# Patient Record
Sex: Male | Born: 1960 | Race: White | Hispanic: No | Marital: Married | Smoking: Never smoker
Health system: Southern US, Community
[De-identification: ages and names within clinical notes are randomized; demographics above are authoritative.]

## PROBLEM LIST (undated history)

## (undated) DIAGNOSIS — K219 Gastro-esophageal reflux disease without esophagitis: Secondary | ICD-10-CM

## (undated) DIAGNOSIS — M545 Low back pain, unspecified: Secondary | ICD-10-CM

## (undated) DIAGNOSIS — J45909 Unspecified asthma, uncomplicated: Secondary | ICD-10-CM

## (undated) DIAGNOSIS — G43909 Migraine, unspecified, not intractable, without status migrainosus: Secondary | ICD-10-CM

## (undated) DIAGNOSIS — T7840XA Allergy, unspecified, initial encounter: Secondary | ICD-10-CM

## (undated) DIAGNOSIS — G4730 Sleep apnea, unspecified: Secondary | ICD-10-CM

## (undated) HISTORY — DX: Sleep apnea, unspecified: G47.30

## (undated) HISTORY — DX: Unspecified asthma, uncomplicated: J45.909

## (undated) HISTORY — DX: Low back pain: M54.5

## (undated) HISTORY — DX: Gastro-esophageal reflux disease without esophagitis: K21.9

## (undated) HISTORY — DX: Migraine, unspecified, not intractable, without status migrainosus: G43.909

## (undated) HISTORY — PX: TONSILLECTOMY: SUR1361

## (undated) HISTORY — PX: HERNIA REPAIR: SHX51

## (undated) HISTORY — DX: Low back pain, unspecified: M54.50

## (undated) HISTORY — DX: Allergy, unspecified, initial encounter: T78.40XA

---

## 2001-07-27 ENCOUNTER — Ambulatory Visit (HOSPITAL_COMMUNITY): Admission: RE | Admit: 2001-07-27 | Discharge: 2001-07-27 | Payer: Self-pay | Admitting: Emergency Medicine

## 2001-07-27 ENCOUNTER — Encounter: Payer: Self-pay | Admitting: Internal Medicine

## 2001-08-21 ENCOUNTER — Emergency Department (HOSPITAL_COMMUNITY): Admission: EM | Admit: 2001-08-21 | Discharge: 2001-08-21 | Payer: Self-pay | Admitting: Emergency Medicine

## 2001-08-21 ENCOUNTER — Encounter: Payer: Self-pay | Admitting: Emergency Medicine

## 2001-11-01 ENCOUNTER — Ambulatory Visit (HOSPITAL_COMMUNITY): Admission: RE | Admit: 2001-11-01 | Discharge: 2001-11-01 | Payer: Self-pay | Admitting: Specialist

## 2001-11-01 ENCOUNTER — Encounter: Payer: Self-pay | Admitting: Specialist

## 2001-11-29 ENCOUNTER — Ambulatory Visit (HOSPITAL_COMMUNITY): Admission: RE | Admit: 2001-11-29 | Discharge: 2001-11-29 | Payer: Self-pay | Admitting: Specialist

## 2004-10-12 ENCOUNTER — Encounter: Admission: RE | Admit: 2004-10-12 | Discharge: 2004-10-12 | Payer: Self-pay | Admitting: Occupational Medicine

## 2007-07-17 ENCOUNTER — Emergency Department (HOSPITAL_COMMUNITY): Admission: EM | Admit: 2007-07-17 | Discharge: 2007-07-17 | Payer: Self-pay | Admitting: Emergency Medicine

## 2007-07-17 ENCOUNTER — Encounter: Payer: Self-pay | Admitting: Emergency Medicine

## 2007-11-28 ENCOUNTER — Emergency Department (HOSPITAL_BASED_OUTPATIENT_CLINIC_OR_DEPARTMENT_OTHER): Admission: EM | Admit: 2007-11-28 | Discharge: 2007-11-28 | Payer: Self-pay | Admitting: Emergency Medicine

## 2009-05-19 ENCOUNTER — Ambulatory Visit: Payer: Self-pay | Admitting: Diagnostic Radiology

## 2009-05-19 ENCOUNTER — Emergency Department (HOSPITAL_BASED_OUTPATIENT_CLINIC_OR_DEPARTMENT_OTHER): Admission: EM | Admit: 2009-05-19 | Discharge: 2009-05-19 | Payer: Self-pay | Admitting: Emergency Medicine

## 2010-02-13 ENCOUNTER — Encounter: Payer: Self-pay | Admitting: Emergency Medicine

## 2010-04-12 LAB — POCT CARDIAC MARKERS
CKMB, poc: 2.4 ng/mL (ref 1.0–8.0)
Myoglobin, poc: 401 ng/mL (ref 12–200)

## 2010-04-12 LAB — COMPREHENSIVE METABOLIC PANEL
AST: 33 U/L (ref 0–37)
Alkaline Phosphatase: 98 U/L (ref 39–117)
Chloride: 111 mEq/L (ref 96–112)
Creatinine, Ser: 1.3 mg/dL (ref 0.4–1.5)
Glucose, Bld: 201 mg/dL — ABNORMAL HIGH (ref 70–99)
Potassium: 3.9 mEq/L (ref 3.5–5.1)
Sodium: 144 mEq/L (ref 135–145)

## 2010-04-12 LAB — LACTIC ACID, PLASMA
Lactic Acid, Venous: 1.2 mmol/L (ref 0.5–2.2)
Lactic Acid, Venous: 2.3 mmol/L — ABNORMAL HIGH (ref 0.5–2.2)

## 2010-04-12 LAB — PROTIME-INR
INR: 1.02 (ref 0.00–1.49)
Prothrombin Time: 13.3 seconds (ref 11.6–15.2)

## 2010-04-12 LAB — CBC
Hemoglobin: 15.3 g/dL (ref 13.0–17.0)
Platelets: 208 10*3/uL (ref 150–400)
WBC: 11.7 10*3/uL — ABNORMAL HIGH (ref 4.0–10.5)

## 2010-04-12 LAB — URINALYSIS, ROUTINE W REFLEX MICROSCOPIC
Glucose, UA: NEGATIVE mg/dL
Ketones, ur: NEGATIVE mg/dL
pH: 5 (ref 5.0–8.0)

## 2010-04-12 LAB — DIFFERENTIAL
Basophils Absolute: 0 10*3/uL (ref 0.0–0.1)
Basophils Relative: 0 % (ref 0–1)
Lymphocytes Relative: 11 % — ABNORMAL LOW (ref 12–46)

## 2010-04-12 LAB — APTT: aPTT: 28 seconds (ref 24–37)

## 2010-06-07 NOTE — Consult Note (Signed)
NAME:  [name], [name] NO.:  [id number]   MEDICAL RECORD NO.:  [id number]          PATIENT TYPE:  EMS   LOCATION:  MAJO                         FACILITY:  [facility]   PHYSICIAN:  Levert Feinstein, MD          DATE OF BIRTH:  April 11, 1960   DATE OF CONSULTATION:  07/17/2007  DATE OF DISCHARGE:  07/17/2007                                 CONSULTATION   CHIEF COMPLAINT:  Headache.   HISTORY OF PRESENT ILLNESS:  The patient is a 50 year old right-handed  male, with past medical history of headache in the past 6 years.  He  described daily chronic holocranial pressure headache, 4/10, however,  couple times a week.  It is often exacerbated to a severe, most often  lateralized left-sided pounding headache with associated nausea,  photophonophobia last couple hours, worsening with motion.  For that  reason, he had been taking ibuprofen on a daily basis.  He was  previously evaluated by my colleague Dr. Vickey Huger, who has suggested  Imitrex; however the patient does not like medicine, he never tried  Imitrex instead he is taking ibuprofen 2 to 4 tablets every day.   In addition, he has described an episode of headache preceded by a  visual aura about 6 years ago, and this morning he had a similar  experience, and he noticed  half moon in his right visual field, lasts  about 20 to 30 minutes, followed by left occipital headache and  spreading to left parietal, temporal, and the retroorbital region,  pounding 8/10, with associated nausea, photophonophobia.  He initially  drove to the [facility] ER by his wife and received a couple doses of  morphine and his headache has gradually subsided.   Currently, he denies any visual change, lateralized motor sensory  deficit, still has 4/10 holocranial pressure headache which is better  than his baseline.   REVIEW OF SYSTEMS:  He has 2 weeks' history of intermittent chest  palpitation, and there was no recurrent similar symptoms today.  There  was no chest pain, no lateralized motor-sensory deficit.   PAST MEDICAL HISTORY:  Migraine headaches, asthma, and rosacea.   HOME MEDICATIONS:  Including ibuprofen, doxycycline p.r.n., and  albuterol inhaler p.r.n.   SOCIAL HISTORY:  He is married, has 2 daughters, is a Biochemist, clinical.  Denies smoke or drink.   PAST SURGICAL HISTORY:  None.   FAMILY HISTORY:  Mother has suffered hypertension, diabetes, and stomach  ulcer.  Brother has diabetes, hyperlipidemia.  Father has deceased.   PHYSICAL EXAMINATION:  VITALS SIGNS:  Temperature 98.2, blood pressure  141/88, heart rate of 85.  CARDIAC:  Regular rate and rhythm.  PULMONARY:  Clear to auscultation bilaterally.  NECK:  Supple.  No carotid bruits.  NEUROLOGIC:  He is alert and oriented.  No dysarthria.  No aphasia.  Cranial nerves II-XII.  Pupils equal, round, reactive to light and  accommodation.  Fundi were sharp bilaterally.  Visual fields were full  on confrontational test.  Facial sensation is normal.  There was a mild  shallow  left nasolabial fold,  which is at his baseline.  Head turning  and shoulder shrugging were normal and symmetric.  Uvula and tongue  midline.  Tongue protrusion into cheek.  Strength was normal.  MOTOR EXAMINATION:  5/5 in all 4 extremities.  Normal tone and bulk.  Sensory intact to light touch, pinprick.  Deep tendon reflexes 2/4.  Plantar responses were flexor.  Coordination, normal finger-to-nose,  heel-to-shin.  There was no dysmetric gait, was narrow based and steady.  He was able to perform tiptoe, heel, tandem walk without difficulty.   CT of the brain without contrast reviewed.  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   ASSESSMENT AND PLAN:  A 50 year old gentleman presenting with migraine  with visual aura, similar episode in the past, nowback to baseline.   1. He does not need to be kept in the  hospital and can be followed up      by the [facility] in 1-2 weeks post discharge      for headache management.  2. For stroke prevention, he will benefit from aspirin 81 mg every      day.  3. The described right medullar 6-mm possible lacunar, indicate an old      stroke at most, and we will complete the rest of his stroke workup      as an outpatient  4. advised the patient to stop daily analgesic use, we will initiate      preventive medication on his office visit.      Levert Feinstein, MD  Electronically Signed     YY/MEDQ  D:  07/17/2007  T:  07/18/2007  Job:  [id number]

## 2010-06-10 NOTE — Op Note (Signed)
NAME:  [name], [name]                           ACCOUNT NO.:  [id number]   MEDICAL RECORD NO.:  [id number]                   PATIENT TYPE:  AMB   LOCATION:  DAY                                  FACILITY:  [facility]   PHYSICIAN:  Jene Every, M.D.                 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 Extensive grade 3 changes of the medial femoral condyle. A complex  tear not only of the posterior but posterolateral portion of the medial  meniscus.   PROCEDURE:  Left knee arthroscopy, chondroplasty of the medial femoral  condyle, partial medial meniscectomy.   ANESTHESIA:  General.   ASSISTANT:  Roma Schanz, P.A..   BRIEF HISTORY AND INDICATIONS:  The patient is a 50 year old with a complex  tear in the posterior horn having mechanical symptoms of giving way,  chondromalacia of the medial femoral condyle, possible grade 4 lesions.  Operative intervention was indicated for partial medial meniscectomy as well  as chondroplasty of the femoral condyle and possible microfracture  technique, a grade 4 lesion was noted. The risks and benefits were discussed  including bleeding, infection, injury to neurovascular structures, no change  in symptoms, worsening symptoms, need for repeat debridement, total knee  arthroplasty in the future, etc.   TECHNIQUE:  With the patient in the supine position after an adequate level  of general endotracheal anesthesia and 1 gm of Kefzol, the left lower  extremity was prepped and draped in the usual sterile fashion. A lateral  parapatellar portal and a  superomedial parapatellar portal was fashioned  with a #11 blade. The ingress cannula was atraumatically placed, irrigant  was utilized to insufflate the joint.   A camera was inserted via the lateral portal and under direct visualization  and the medial parapatellar portal was fashioned with a #11 blade, ingress  cannula atraumatically placed. Inspection revealed normal patellofemoral  tracking, some minor grade 2 changes of the patella. The ACL and PCL were  unremarkable. The lateral compartment revealed normal lateral meniscus,  femoral condyle and tibial plateau and lateral meniscus stable to palpation.  Neither compartment revealed extensive grade 3 changes of the femoral  condyle more of the weightbearing surface and posteriorly the area of the  MRI which showed the grade 4 lesion. It was actually a grade 3 with a  cleavage down to but not including the bone and therefore that  microfracturing technique was unnecessary here. I introduced the shaver and  performed a chondroplasty to a stable base. I then evaluated  the meniscus  which was extremely unstable. Posteriorly, there were extensive horizontal  and vertical components with a complex tear noted. This extended to this  surface and to the vascular junction; however, due to the multiple cleavage  planes, I felt this was not a repairable meniscus. Again unstable to  palpation. I therefore introduced a basket rongeur and performed a partial  medial meniscectomy to a stable base to the juncture of the middle and the  posterior third posteriorly; however, medially I had extended through the  mid portion of the meniscus where it was displaced and unstable and complex.  I therefore continued the resection there contouring medially and anteriorly  such that it would not be unstable with the remnant of the meniscus. This  was completed with the basket rongeur, a shaver, a right angled rongeur as  well. Due to the tightness of the joint in  the chondromalacia, it was  somewhat difficult. We resected it to a stable base. We then used  electrocautery to cauterize the slight bleeding that was noted. The  resection at least anterior and medially extended to the vascular junction.  Following this, I flexion extended the knee and put it in multiple positions  including the figure four and there was no invagination of the remnant of  the medial meniscus into the joint. There was no McMurray or any mechanical  problem noted. The knee was copiously lavaged. I reduced the pressure and  there was no evidence of active bleeding. I copiously lavaged the knee joint  and reinspected the medial femoral condyle again extensive grade 3 changes  but stable base. The remainder of the meniscus was stable as well as some  grade 2-3 changes on the tibial plateau as well. The knee copiously lavaged,  all instrumentation was removed, portals were closed with 4-0 nylon simple  sutures. A 0.25% Marcaine with epinephrine was infiltrated in the joint. The  wound was dressed sterilely, he was awakened without difficulty and  transported to the recovery room in satisfactory condition. The patient  tolerated the procedure well with no complications.                                               Jene Every, M.D.    Cordelia Pen  D:  11/29/2001  T:  11/29/2001  Job:  [id number]

## 2010-10-20 LAB — BASIC METABOLIC PANEL
CO2: 25
Creatinine, Ser: 0.9
GFR calc Af Amer: >60
GFR calc non Af Amer: >60
Sodium: 144

## 2010-10-20 LAB — POCT CARDIAC MARKERS
Operator id: 5513
Troponin i, poc: <0.05

## 2012-07-29 ENCOUNTER — Encounter: Payer: Self-pay | Admitting: Neurology

## 2012-07-29 DIAGNOSIS — M545 Low back pain, unspecified: Secondary | ICD-10-CM | POA: Insufficient documentation

## 2012-07-29 DIAGNOSIS — G43909 Migraine, unspecified, not intractable, without status migrainosus: Secondary | ICD-10-CM | POA: Insufficient documentation

## 2012-07-30 ENCOUNTER — Ambulatory Visit: Payer: 59 | Admitting: Neurology

## 2012-08-15 ENCOUNTER — Ambulatory Visit (INDEPENDENT_AMBULATORY_CARE_PROVIDER_SITE_OTHER): Payer: 59 | Admitting: Neurology

## 2012-08-15 ENCOUNTER — Encounter: Payer: Self-pay | Admitting: Neurology

## 2012-08-15 VITALS — BP 126/79 | HR 92 | Ht 72.0 in | Wt 208.5 lb

## 2012-08-15 DIAGNOSIS — G4733 Obstructive sleep apnea (adult) (pediatric): Secondary | ICD-10-CM

## 2012-08-15 DIAGNOSIS — M545 Low back pain, unspecified: Secondary | ICD-10-CM

## 2012-08-15 DIAGNOSIS — G43909 Migraine, unspecified, not intractable, without status migrainosus: Secondary | ICD-10-CM

## 2012-08-15 MED ORDER — RIZATRIPTAN BENZOATE 10 MG PO TABS: 10.0000 mg | ORAL_TABLET | ORAL | Status: DC | PRN

## 2012-08-15 MED ORDER — DIVALPROEX SODIUM ER 500 MG PO TB24: 500.0000 mg | ORAL_TABLET | Freq: Every day | ORAL | Status: DC

## 2012-08-15 NOTE — Progress Notes (Signed)
[facility]  PATIENT: [name] DOB: 04/26/1960  HISTORICAL  [name] is a 52 years old right-handed Caucasian male, referred by his primary care physician for evaluation of frequent headaches,  He had a history of migraine since 2004, initially he only has headaches once every one or 2 months, since January 2014, he had increased frequency of headaches, about couple times a month, his typical migraine is preceded by visual aura, he noticed half moon in his visual field, he could not read through it,  lasting about 20 minutes, followed by severe holoacranial headaches, pounding, with associated light noise sensitivity, sensitive to smells, it lasts 2-3 hours, he has tried Relpax, which is not helpful.  In addition to his typical migraines, he also has daily frequent pressure headaches, occipital region, left upper nuchal area, pressure, he has been taking ibuprofen 200 mg 3 tablets 2-3 times, he has been doing that for 10 years,  He has tried Topamax in the past, which did not help his headaches  He also complains of excessive daytime sleepiness, nighttime snoring, on today's examination, he was noticed to have a very small jaw, ESS score is 14, FSS is 44   REVIEW OF SYSTEMS: Full 14 system review of systems performed and notable only for fatigue, hearing loss, ringing in ears, joint pain, achy muscles, energy, headaches, restless legs, snoring, anxiety, not enough sleep  ALLERGIES: No Known Allergies  HOME MEDICATIONS: Outpatient Prescriptions Prior to Visit  Medication Sig Dispense Refill  . albuterol (PROVENTIL HFA;VENTOLIN HFA) 108 (90 BASE) MCG/ACT inhaler Inhale 2 puffs into the lungs every 6 (six) hours as needed for wheezing.      Marland Kitchen guaiFENesin (MUCINEX) 600 MG 12 hr tablet Take 1,200 mg by mouth 2 (two) times daily.      Marland Kitchen loratadine (CLARITIN) 10 MG tablet Take 10 mg by mouth daily.      Marland Kitchen topiramate (TOPAMAX) 25 MG capsule Take 25 mg by mouth 2 (two)  times daily.      . traMADol (ULTRAM) 50 MG tablet Take 50 mg by mouth every 6 (six) hours as needed for pain.       No facility-administered medications prior to visit.    PAST MEDICAL HISTORY: Past Medical History  Diagnosis Date  . Migraines   . Low back pain     PAST SURGICAL HISTORY: Past Surgical History  Procedure Laterality Date  . Hernia repair      FAMILY HISTORY: Family History  Problem Relation Age of Onset  . Alcoholism Father   . Diabetes Mother   . Diabetes Brother   . High Cholesterol Brother   . High Cholesterol Mother     SOCIAL HISTORY:  History   Social History  . Marital Status: Married    Spouse Name: [name]    Number of Children: 3  . Years of Education: 12   Occupational History  . Maintenance work on residential homes   Social History Main Topics  . Smoking status: Never Smoker   . Smokeless tobacco: Never Used  . Alcohol Use: No  . Drug Use: No  . Sexually Active: Not on file    Social History Narrative   Patient lives at home with his wife. ([name]). Patient has some college.   Caffeine- two daily   Right handed           PHYSICAL EXAM    Filed Vitals:   08/15/12 0838  BP: 126/79  Pulse: 92  Height:  6' (1.829 m)  Weight: 208 lb 8 oz (94.575 kg)     Body mass index is 28.27 kg/(m^2).   Generalized: In no acute distress  Neck: Supple, no carotid bruits   Cardiac: Regular rate rhythm  Pulmonary: Clear to auscultation bilaterally  Musculoskeletal: No deformity  Neurological examination  Mentation: Alert oriented to time, place, history taking, and causual conversation  Cranial nerve II-XII: Pupils were equal round reactive to light extraocular movements were full, visual field were full on confrontational test. facial sensation and strength were normal. hearing was intact to finger rubbing bilaterally. Uvula tongue midline.  head turning and shoulder shrug and were normal and symmetric.Tongue protrusion into  cheek strength was normal.  Motor: normal tone, bulk and strength.  Sensory: Intact to fine touch, pinprick, preserved vibratory sensation, and proprioception at toes.  Coordination: Normal finger to nose, heel-to-shin bilaterally there was no truncal ataxia  Gait: Rising up from seated position without assistance, normal stance, without trunk ataxia, moderate stride, good arm swing, smooth turning, able to perform tiptoe, and heel walking without difficulty.   Romberg signs: Negative  Deep tendon reflexes: Brachioradialis 2/2, biceps 2/2, triceps 2/2, patellar 2/2, Achilles 2/2, plantar responses were flexor bilaterally.   DIAGNOSTIC DATA (LABS, IMAGING, TESTING) - I reviewed patient records, labs, notes, testing and imaging myself where available.  Lab Results  Component Value Date   WBC 11.7* 05/19/2009   HGB 15.3 05/19/2009   HCT 43.5 05/19/2009   MCV 100.0 05/19/2009   PLT 208 05/19/2009      Component Value Date/Time   NA 144 05/19/2009 1115   K 3.9 05/19/2009 1115   CL 111 05/19/2009 1115   CO2 22 05/19/2009 1115   GLUCOSE 201* 05/19/2009 1115   BUN 16 05/19/2009 1115   CREATININE 1.3 05/19/2009 1115   CALCIUM 9.2 05/19/2009 1115   PROT 7.5 05/19/2009 1115   ALBUMIN 4.0 05/19/2009 1115   AST 33 05/19/2009 1115   ALT 38 05/19/2009 1115   ALKPHOS 98 05/19/2009 1115   BILITOT 0.9 05/19/2009 1115   GFRNONAA 59* 05/19/2009 1115   GFRAA  Value: >60        The eGFR has been calculated using the MDRD equation. This calculation has not been validated in all clinical situations. eGFR's persistently <60 mL/min signify possible Chronic Kidney Disease. 05/19/2009 1115    ASSESSMENT AND PLAN   52 years old Caucasian male, with history of migraine headaches, also a component of medicine rebound headache with his daily ibuprofen use, normal neurological examination, he has small jaw, ESS is 14, FSS 44, nighttime snoring, excessive daytime sleepiness,  1. consistent with obstructive sleep apnea, I  ordered sleep study,   2. Taper off daily ibuprofen use. 3. Depakote xr 500mg  qhs as preventive treatment. 4.  Maxalt as needed.   Levert Feinstein, M.D. Ph.D.  [facility] [address] [place], [place] [address] [phone]

## 2012-10-03 ENCOUNTER — Telehealth: Payer: Self-pay | Admitting: Neurology

## 2012-12-05 ENCOUNTER — Ambulatory Visit: Payer: 59 | Admitting: Neurology

## 2013-04-10 ENCOUNTER — Telehealth: Payer: Self-pay | Admitting: Neurology

## 2013-04-10 NOTE — Telephone Encounter (Signed)
Called [name] and [name] stated that he has had 3 migraines w/aura in the last 26 hours and they are making him sick. [name] states that his medications are not helping, ibuprofen helps a little. [name]'s last OV was on 08/15/12. I made [name] an appt on 04/14/13 with Dr. Terrace ArabiaYan. [name] wanted to know if there is anything the doctor can suggest while waiting for the appt. Please advise

## 2013-04-10 NOTE — Telephone Encounter (Signed)
Pt called and asked if it would be possible to get in to see Dr. Terrace ArabiaYan or the nurse practitioner.  He has had 3 migraines w/aura in the last 26 hours and this is unusual for him. He states that they are making him sick as well.  His prescription medicine doesn't seem to be working and ibuprofen works a little bit but doesn't take the pain away completely.  Please contact.  Thank you

## 2013-04-11 NOTE — Telephone Encounter (Signed)
Chart reviewed, last visit was in 2009 and 2011, need to see patient first before make decision

## 2013-04-14 ENCOUNTER — Telehealth: Payer: Self-pay | Admitting: Neurology

## 2013-04-14 ENCOUNTER — Ambulatory Visit (INDEPENDENT_AMBULATORY_CARE_PROVIDER_SITE_OTHER): Payer: 59 | Admitting: Neurology

## 2013-04-14 ENCOUNTER — Encounter: Payer: Self-pay | Admitting: Neurology

## 2013-04-14 ENCOUNTER — Encounter (INDEPENDENT_AMBULATORY_CARE_PROVIDER_SITE_OTHER): Payer: Self-pay

## 2013-04-14 VITALS — BP 132/84 | HR 83 | Ht 73.0 in | Wt 222.0 lb

## 2013-04-14 DIAGNOSIS — R519 Headache, unspecified: Secondary | ICD-10-CM

## 2013-04-14 DIAGNOSIS — G43909 Migraine, unspecified, not intractable, without status migrainosus: Secondary | ICD-10-CM

## 2013-04-14 DIAGNOSIS — M545 Low back pain, unspecified: Secondary | ICD-10-CM

## 2013-04-14 DIAGNOSIS — R0683 Snoring: Secondary | ICD-10-CM

## 2013-04-14 DIAGNOSIS — R51 Headache: Secondary | ICD-10-CM

## 2013-04-14 DIAGNOSIS — E669 Obesity, unspecified: Secondary | ICD-10-CM

## 2013-04-14 DIAGNOSIS — R400 Somnolence: Secondary | ICD-10-CM

## 2013-04-14 DIAGNOSIS — G4733 Obstructive sleep apnea (adult) (pediatric): Secondary | ICD-10-CM

## 2013-04-14 MED ORDER — DIVALPROEX SODIUM ER 500 MG PO TB24: 500.0000 mg | ORAL_TABLET | Freq: Every day | ORAL | Status: DC

## 2013-04-14 MED ORDER — CELECOXIB 100 MG PO CAPS
100.0000 mg | ORAL_CAPSULE | Freq: Two times a day (BID) | ORAL | Status: DC
Start: 2013-04-14 — End: 2014-10-05

## 2013-04-14 MED ORDER — RIZATRIPTAN BENZOATE 10 MG PO TBDP: 10.0000 mg | ORAL_TABLET | ORAL | Status: DC | PRN

## 2013-04-14 NOTE — Progress Notes (Signed)
[facility]  PATIENT: [name] DOB: 02-19-1960  HISTORICAL  [name] is a 53 years old right-handed Caucasian male, referred by his primary care physician for evaluation of frequent headaches,  He had a history of migraine since 2004, initially he only has headaches once every one or 2 month, since January 2014, he had increased frequency of headaches, about couple times a month, his typical migraine is preceded by visual aura, he noticed half moon in his visual field, he could not read through it,  lasting about 20 minutes, followed by severe holoacranial headaches, pounding, with associated light noise sensitivity, sensitive to smells, it lasts 2-3 hours, he has tried Relpax, which is not helpful.  In addition to his typical migraines, he also has daily frequent pressure headaches, occipital region, left upper nuchal area, pressure, he has been taking ibuprofen 200 mg 3 tablets 2-3 times, he has been doing that for 10 years,  He has tried Topamax in the past, which did not help his headaches  He also complains of excessive daytime sleepiness, nighttime snoring, on today's examination, he was noticed to have a very small jaw, ESS score is 14, FSS is 45  UPDATE April 14 2013: He still takes daily ibuprofen 222m 6 tabs daily, he has daily headaches 5/10, pressure, typical migraines proceed with visual aura occurred, he had 3 typical migraines within 48 hours, lasting 20 minutes, followed by bad headaches several hours, He never tried mONEOK  He got very scared during headaches. 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  Depakote was helping him, he has gone 2 months without his typical migraine.  Ess is 15, FSS is 42 today, sometimes he went up in the middle of the night finds himself choking gasping for air   REVIEW OF SYSTEMS: Full 14 system review of systems  performed and notable only for high itching, light sensitivity, ear discharge, ringing in ears, runny nose, excessive thirst, abdominal pain, vs. lack, insomnia, snoring, environmental allergies, frequent urination, joint pain, back pain, achy muscles, headache, anxiety, snoring,  ALLERGIES: No Known Allergies  HOME MEDICATIONS: Outpatient Prescriptions Prior to Visit  Medication Sig Dispense Refill  . albuterol (PROVENTIL HFA;VENTOLIN HFA) 108 (90 BASE) MCG/ACT inhaler Inhale 2 puffs into the lungs every 6 (six) hours as needed for wheezing.      . divalproex (DEPAKOTE ER) 500 MG 24 hr tablet Take 1 tablet (500 mg total) by mouth daily.  30 tablet  12  . Ibuprofen 200 MG CAPS Take by mouth 2 (two) times daily.      . rizatriptan (MAXALT) 10 MG tablet Take 1 tablet (10 mg total) by mouth as needed for migraine. May repeat in 2 hours if needed  15 tablet  12   No facility-administered medications prior to visit.    PAST MEDICAL HISTORY: Past Medical History  Diagnosis Date  . Migraines   . Low back pain     PAST SURGICAL HISTORY: Past Surgical History  Procedure Laterality Date  . Hernia repair      FAMILY HISTORY: Family History  Problem Relation Age of Onset  . Alcoholism Father   . Diabetes Mother   . Diabetes Brother   . High Cholesterol Brother   . High Cholesterol Mother     SOCIAL HISTORY:  History   Social History  . Marital Status: Married  Spouse Name: [name]    Number of Children: 3  . Years of Education: 12   Occupational History  . Maintenance work on residential homes   Social History Main Topics  . Smoking status: Never Smoker   . Smokeless tobacco: Never Used  . Alcohol Use: No  . Drug Use: No  . Sexually Active: Not on file    Social History Narrative   Patient lives at home with his wife. ([name]). Patient has some college.   Caffeine- two daily   Right handed           PHYSICAL EXAM    Filed Vitals:   04/14/13 0817  BP: 132/84   Pulse: 83  Height: 6' 1"  (1.854 m)  Weight: 222 lb (100.699 kg)     Body mass index is 29.3 kg/(m^2).   Generalized: In no acute distress  Neck: Supple, no carotid bruits   Cardiac: Regular rate rhythm  Pulmonary: Clear to auscultation bilaterally  Musculoskeletal: No deformity  Neurological examination  Mentation: Alert oriented to time, place, history taking, and causual conversation  Cranial nerve II-XII: Pupils were equal round reactive to light extraocular movements were full, visual field were full on confrontational test. facial sensation and strength were normal. hearing was intact to finger rubbing bilaterally. Uvula tongue midline.  head turning and shoulder shrug and were normal and symmetric.Tongue protrusion into cheek strength was normal.  Motor: normal tone, bulk and strength.  Sensory: Intact to fine touch, pinprick, preserved vibratory sensation, and proprioception at toes.  Coordination: Normal finger to nose, heel-to-shin bilaterally there was no truncal ataxia  Gait: Rising up from seated position without assistance, normal stance, without trunk ataxia, moderate stride, good arm swing, smooth turning, able to perform tiptoe, and heel walking without difficulty.   Romberg signs: Negative  Deep tendon reflexes: Brachioradialis 2/2, biceps 2/2, triceps 2/2, patellar 2/2, Achilles 2/2, plantar responses were flexor bilaterally.   DIAGNOSTIC DATA (LABS, IMAGING, TESTING) - I reviewed patient records, labs, notes, testing and imaging myself where available.  Lab Results  Component Value Date   WBC 11.7* 05/19/2009   HGB 15.3 05/19/2009   HCT 43.5 05/19/2009   MCV 100.0 05/19/2009   PLT 208 05/19/2009      Component Value Date/Time   NA 144 05/19/2009 1115   K 3.9 05/19/2009 1115   CL 111 05/19/2009 1115   CO2 22 05/19/2009 1115   GLUCOSE 201* 05/19/2009 1115   BUN 16 05/19/2009 1115   CREATININE 1.3 05/19/2009 1115   CALCIUM 9.2 05/19/2009 1115   PROT 7.5  05/19/2009 1115   ALBUMIN 4.0 05/19/2009 1115   AST 33 05/19/2009 1115   ALT 38 05/19/2009 1115   ALKPHOS 98 05/19/2009 1115   BILITOT 0.9 05/19/2009 1115   GFRNONAA 59* 05/19/2009 1115   GFRAA  Value: >60        The eGFR has been calculated using the MDRD equation. This calculation has not been validated in all clinical situations. eGFR's persistently <60 mL/min signify possible Chronic Kidney Disease. 05/19/2009 1115    ASSESSMENT AND PLAN   53 years old Caucasian male, with history of migraine headaches, also a component of medicine rebound headache with his daily ibuprofen use, normal neurological examination, he has small jaw, ESS is 15 FSS 42, nighttime snoring, excessive daytime sleepiness,  1. consistent with obstructive sleep apnea, I ordered sleep study,   2. Taper off daily ibuprofen use. celebrex 178m bid for joints pain 3. Depakote xr [id number]  as preventive treatment. 4.  Maxalt as needed.   Marcial Pacas, M.D. Ph.D.  [facility] [address], [place], [place] [address] [phone]

## 2013-04-14 NOTE — Telephone Encounter (Signed)
Dr. Terrace ArabiaYan, refers patient for attended sleep study.  Height: 6'1  Weight: 222 lbs.  BMI: 29.30  Past Medical History:   Migraines  Low back pain   Sleep Symptoms: nighttime snoring, excessive daytime sleepiness,  Epworth Score: 15  Medications: Albuterol Sulfate (Aero Soln) PROVENTIL HFA;VENTOLIN HFA 108 (90 BASE) MCG/ACT Inhale 2 puffs into the lungs every 6 (six) hours as needed for wheezing. Butalbital-Acetaminophen (Tab) ACETAMINOPHEN-BUTALBITAL 50-325 MG Take by mouth as needed. Celecoxib (Cap) CELEBREX 100 MG Take 1 capsule (100 mg total) by mouth 2 (two) times daily. Divalproex Sodium (Tablet SR 24 hr) DEPAKOTE ER 500 MG Take 1 tablet (500 mg total) by mouth daily. Ibuprofen (Cap) Ibuprofen 200 MG Take by mouth 2 (two) times daily. Rizatriptan Benzoate (Tablet Dispersible) MAXALT-MLT 10 MG Take 1 tablet (10 mg total) by mouth as needed for migraine. May repeat in 2 hours if needed    Insurance: [employer]  Dr. Zannie CoveYan's ASSESSMENT AND PLAN:  53 years old Caucasian male, with history of migraine headaches, also a component of medicine rebound headache with his daily ibuprofen use, normal neurological examination, he has small jaw, ESS is 15 FSS 42, nighttime snoring, excessive daytime sleepiness,   1. consistent with obstructive sleep apnea, I ordered sleep study,  2. Taper off daily ibuprofen use. celebrex 100mg  bid for joints pain  3. Depakote xr 500mg  qhs as preventive treatment.  4. Maxalt as needed.     Please review patient information and submit instructions for scheduling and orders for sleep technologist.  Thank you!

## 2013-04-14 NOTE — Telephone Encounter (Signed)
This patient is referred by Dr. Terrace ArabiaYan for a sleep study due to a report of recurrent headaches, excessive daytime somnolence and snoring. I will order a split-night sleep study and see the patient in sleep medicine consultation afterwards.

## 2013-04-16 ENCOUNTER — Telehealth: Payer: Self-pay | Admitting: Neurology

## 2013-04-16 NOTE — Telephone Encounter (Signed)
Called pt to inform him that Dr. Terrace ArabiaYan is out of the office until 05/07/13 and that Dr. Terrace ArabiaYan did not order a MRI on him, just a sleep study. Pt stated that they talked about it.  I advised the pt that if he has any other problems, questions or concerns to call the office. Pt verbalized understanding.

## 2013-04-16 NOTE — Telephone Encounter (Signed)
Patient called to state that Dr. Terrace Carlson suggested an MRI be done on him, and patient is wondering if there is a way that he can have a CT scan instead. Please call patient and advise.

## 2013-07-29 ENCOUNTER — Ambulatory Visit: Payer: 59 | Admitting: Nurse Practitioner

## 2013-08-28 NOTE — Telephone Encounter (Signed)
Noted  

## 2014-05-29 DIAGNOSIS — R350 Frequency of micturition: Secondary | ICD-10-CM | POA: Insufficient documentation

## 2014-06-02 ENCOUNTER — Telehealth: Payer: Self-pay | Admitting: *Deleted

## 2014-06-02 NOTE — Telephone Encounter (Signed)
Error

## 2014-06-15 ENCOUNTER — Ambulatory Visit: Payer: Self-pay | Admitting: Family Medicine

## 2014-09-26 ENCOUNTER — Emergency Department (HOSPITAL_BASED_OUTPATIENT_CLINIC_OR_DEPARTMENT_OTHER)
Admission: EM | Admit: 2014-09-26 | Discharge: 2014-09-26 | Disposition: A | Discharge status: Home / Self Care | Payer: 59 | Attending: Emergency Medicine | Admitting: Emergency Medicine

## 2014-09-26 ENCOUNTER — Encounter (HOSPITAL_BASED_OUTPATIENT_CLINIC_OR_DEPARTMENT_OTHER): Payer: Self-pay

## 2014-09-26 ENCOUNTER — Emergency Department (HOSPITAL_BASED_OUTPATIENT_CLINIC_OR_DEPARTMENT_OTHER): Payer: 59

## 2014-09-26 DIAGNOSIS — R200 Anesthesia of skin: Secondary | ICD-10-CM | POA: Insufficient documentation

## 2014-09-26 DIAGNOSIS — Z79899 Other long term (current) drug therapy: Secondary | ICD-10-CM | POA: Diagnosis not present

## 2014-09-26 DIAGNOSIS — G43909 Migraine, unspecified, not intractable, without status migrainosus: Secondary | ICD-10-CM | POA: Insufficient documentation

## 2014-09-26 DIAGNOSIS — Z791 Long term (current) use of non-steroidal anti-inflammatories (NSAID): Secondary | ICD-10-CM | POA: Diagnosis not present

## 2014-09-26 DIAGNOSIS — R202 Paresthesia of skin: Secondary | ICD-10-CM | POA: Diagnosis not present

## 2014-09-26 LAB — CBC
HEMATOCRIT: 39.3 % (ref 39.0–52.0)
Hemoglobin: 14.1 g/dL (ref 13.0–17.0)
MCH: 36 pg — AB (ref 26.0–34.0)
MCHC: 35.9 g/dL (ref 30.0–36.0)
MCV: 100.3 fL — ABNORMAL HIGH (ref 78.0–100.0)
Platelets: 196 10*3/uL (ref 150–400)
RBC: 3.92 MIL/uL — ABNORMAL LOW (ref 4.22–5.81)
RDW: 12.3 % (ref 11.5–15.5)
WBC: 6.8 10*3/uL (ref 4.0–10.5)

## 2014-09-26 LAB — ETHANOL

## 2014-09-26 LAB — URINALYSIS, ROUTINE W REFLEX MICROSCOPIC
BILIRUBIN URINE: NEGATIVE
Glucose, UA: NEGATIVE mg/dL
HGB URINE DIPSTICK: NEGATIVE
Ketones, ur: NEGATIVE mg/dL
Leukocytes, UA: NEGATIVE
Nitrite: NEGATIVE
PH: 5 (ref 5.0–8.0)
Protein, ur: NEGATIVE mg/dL
SPECIFIC GRAVITY, URINE: 1.013 (ref 1.005–1.030)
Urobilinogen, UA: 0.2 mg/dL (ref 0.0–1.0)

## 2014-09-26 LAB — DIFFERENTIAL
BASOS ABS: 0 10*3/uL (ref 0.0–0.1)
BASOS PCT: 0 % (ref 0–1)
EOS ABS: 0.1 10*3/uL (ref 0.0–0.7)
EOS PCT: 2 % (ref 0–5)
Lymphocytes Relative: 32 % (ref 12–46)
Lymphs Abs: 2.2 10*3/uL (ref 0.7–4.0)
MONO ABS: 0.5 10*3/uL (ref 0.1–1.0)
MONOS PCT: 7 % (ref 3–12)
Neutro Abs: 4 10*3/uL (ref 1.7–7.7)
Neutrophils Relative %: 59 % (ref 43–77)

## 2014-09-26 LAB — RAPID URINE DRUG SCREEN, HOSP PERFORMED
AMPHETAMINES: NOT DETECTED
BENZODIAZEPINES: NOT DETECTED
Barbiturates: NOT DETECTED
COCAINE: NOT DETECTED
OPIATES: NOT DETECTED
TETRAHYDROCANNABINOL: NOT DETECTED

## 2014-09-26 LAB — COMPREHENSIVE METABOLIC PANEL
ALT: 29 U/L (ref 17–63)
ANION GAP: 7 (ref 5–15)
AST: 28 U/L (ref 15–41)
Albumin: 3.9 g/dL (ref 3.5–5.0)
Alkaline Phosphatase: 86 U/L (ref 38–126)
BUN: 16 mg/dL (ref 6–20)
CHLORIDE: 106 mmol/L (ref 101–111)
CO2: 26 mmol/L (ref 22–32)
Calcium: 9.1 mg/dL (ref 8.9–10.3)
Creatinine, Ser: 1.13 mg/dL (ref 0.61–1.24)
Glucose, Bld: 119 mg/dL — ABNORMAL HIGH (ref 65–99)
POTASSIUM: 3.6 mmol/L (ref 3.5–5.1)
Sodium: 139 mmol/L (ref 135–145)
TOTAL PROTEIN: 7.6 g/dL (ref 6.5–8.1)
Total Bilirubin: 0.6 mg/dL (ref 0.3–1.2)

## 2014-09-26 LAB — APTT: APTT: 28 s (ref 24–37)

## 2014-09-26 LAB — PROTIME-INR
INR: 1.08 (ref 0.00–1.49)
Prothrombin Time: 14.2 seconds (ref 11.6–15.2)

## 2014-09-26 NOTE — ED Notes (Signed)
Pt instructed to return for severely worsening sx or new development of numbness, weakness, aphasia, dysarthria, confusion, or dizziness.

## 2014-09-26 NOTE — ED Notes (Signed)
Patient transported to CT via stretcher, sr x 2 up  

## 2014-09-26 NOTE — ED Notes (Signed)
FAST: negative 

## 2014-09-26 NOTE — ED Notes (Addendum)
Pt states was working outside and felt pain from rt hand to rt arm to rt side, sensation of like being electrocuted. Lasted approx 30-45 sec per pt statement

## 2014-09-26 NOTE — ED Notes (Signed)
MD at bedside. 

## 2014-09-26 NOTE — ED Notes (Signed)
Family at bedside. Care explained to wife, opportunity for questions provided

## 2014-09-26 NOTE — ED Notes (Signed)
Pt reports right arm numbness since 1715. States right arm has decreased sensation, reports touch feels lighter on right arm. Smile symmetrical. EDP called to bedside.

## 2014-09-26 NOTE — ED Provider Notes (Signed)
CSN: [id number]     Arrival date & time 09/26/14  1803 History  This chart was scribed for Eber Hong, MD by Doreatha Martin, ED Scribe. This patient was seen in room MH03/MH03 and the patient's care was started at [facility] PM.     Chief Complaint  Patient presents with  . Numbness   The history is provided by the patient. No language interpreter was used.    HPI Comments: [name] is a 54 y.o. male with hx of migraines who presents to the Emergency Department complaining of RUE numbness and tingling onset tonight. 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 He also notes a recent recurring painful "pop" in his right side (3 episodes so far). He notes that the numbness has since relieved, but still has numbness to the palm of his right hand. Pt reports that he has been feeling shaky after using power tools recently. He denies numbness or tingling in the neck, head, BLE, left arm, visual disturbance.   Past Medical History  Diagnosis Date  . Migraines   . Low back pain    Past Surgical History  Procedure Laterality Date  . Hernia repair      1976  . Tonsillectomy     Family History  Problem Relation Age of Onset  . Alcoholism Father   . Diabetes Mother   . Diabetes Brother   . High Cholesterol Brother   . High Cholesterol Mother    Social History  Substance Use Topics  . Smoking status: Never Smoker   . Smokeless tobacco: Never Used  . Alcohol Use: No    Review of Systems  Eyes: Negative for visual disturbance.  Neurological: Positive for numbness.       Tingling sensation  All other systems reviewed and are negative.   Allergies  Review of patient's allergies indicates no known allergies.  Home Medications   Prior to Admission medications   Medication Sig Start Date End Date  Taking? Authorizing Provider  ACETAMINOPHEN-BUTALBITAL 50-325 MG TABS Take by mouth as needed.   Yes Historical Provider, MD  albuterol (PROVENTIL HFA;VENTOLIN HFA) 108 (90 BASE) MCG/ACT inhaler Inhale 2 puffs into the lungs every 6 (six) hours as needed for wheezing.   Yes Historical Provider, MD  Ibuprofen 200 MG CAPS Take by mouth 2 (two) times daily.   Yes Historical Provider, MD  celecoxib (CELEBREX) 100 MG capsule Take 1 capsule (100 mg total) by mouth 2 (two) times daily. 04/14/13   Levert Feinstein, MD  divalproex (DEPAKOTE ER) 500 MG 24 hr tablet Take 1 tablet (500 mg total) by mouth daily. 04/14/13   Levert Feinstein, MD  rizatriptan (MAXALT-MLT) 10 MG disintegrating tablet Take 1 tablet (10 mg total) by mouth as needed for migraine. May repeat in 2 hours if needed 04/14/13   Levert Feinstein, MD   BP 160/83 mmHg  Pulse 97  Temp(Src) 98 F (36.7 C) (Oral)  Resp 16  Ht 6' (1.829 m)  Wt 215 lb (97.523 kg)  BMI 29.15 kg/m2  SpO2 98% Physical Exam  Constitutional: He appears well-developed and well-nourished. No distress.  HENT:  Head: Normocephalic and atraumatic.  Mouth/Throat: Oropharynx is clear and moist. No oropharyngeal exudate.  Eyes: Conjunctivae and EOM are normal. Pupils are equal, round, and reactive  to light. Right eye exhibits no discharge. Left eye exhibits no discharge. No scleral icterus.  Neck: Normal range of motion. Neck supple. No JVD present. No thyromegaly present.  Cardiovascular: Normal rate, regular rhythm, normal heart sounds and intact distal pulses.  Exam reveals no gallop and no friction rub.   No murmur heard. Pulmonary/Chest: Effort normal and breath sounds normal. No respiratory distress. He has no wheezes. He has no rales.  Abdominal: Soft. Bowel sounds are normal. He exhibits no distension and no mass. There is no tenderness.  Musculoskeletal: Normal range of motion. He exhibits no edema or tenderness.  Lymphadenopathy:    He has no cervical adenopathy.  Neurological:  He is alert. Coordination normal.  Slight asymmetry of sensation of right to left upper extremity. Right upper extremity, decreased sensation, normal motor coordination and strength.    Otherwise  Neurologic exam:  Speech clear, pupils equal round reactive to light, extraocular movements intact  Normal peripheral visual fields Cranial nerves III through XII normal including no facial droop Follows commands, moves all extremities x4, normal strength to bilateral upper and lower extremities at all major muscle groups including grip Sensation normal to light touch and pinprick Coordination intact, no limb ataxia, finger-nose-finger normal Rapid alternating movements normal No pronator drift Gait normal    Skin: Skin is warm and dry. No rash noted. No erythema.  Psychiatric: He has a normal mood and affect. His behavior is normal.  Nursing note and vitals reviewed.   ED Course  Procedures (including critical care time) DIAGNOSTIC STUDIES: Oxygen Saturation is 98% on RA, normal by my interpretation.    COORDINATION OF CARE: 6:22 PM Discussed treatment plan with pt at bedside and pt agreed to plan.   Labs Review Labs Reviewed  ETHANOL  PROTIME-INR  APTT  CBC  DIFFERENTIAL  COMPREHENSIVE METABOLIC PANEL  URINE RAPID DRUG SCREEN, HOSP PERFORMED  URINALYSIS, ROUTINE W REFLEX MICROSCOPIC (NOT AT [facility])  I-STAT CHEM 8, ED  I-STAT TROPOININ, ED    Imaging Review No results found. I have personally reviewed and evaluated these images and lab results as part of my medical decision-making.   EKG Interpretation   Date/Time:  Saturday September 26 2014 18:32:42 EDT Ventricular Rate:  98 PR Interval:  128 QRS Duration: 92 QT Interval:  370 QTC Calculation: 472 R Axis:   -40 Text Interpretation:  Normal sinus rhythm Left axis deviation Abnormal ECG  Since last tracing rate slower Confirmed by Abhijay Morriss  MD, Laketra Bowdish ([id number]) on  09/26/2014 7:42:48 PM      MDM   Final diagnoses:   None    No change in status, no recurrence of symptoms, discussed with neurology, they agree he can be seen as an outpatient, start aspirin daily.  Pt in agreement  Meds given in ED:  Medications - No data to display  New Prescriptions   No medications on file    I personally performed the services described in this documentation, which was scribed in my presence. The recorded information has been reviewed and is accurate.      Eber Hong, MD 09/26/14 2014

## 2014-09-26 NOTE — Discharge Instructions (Signed)
Please see the attached follow up information for both family doctors and the Neurologist - you MUST start taking an Aspirin  daily and follow up in next 2 weeks to have MRI of the brain / Echocardiogram of the chest.  Please obtain all of your results from medical records or have your doctors office obtain the results - share them with your doctor - you should be seen at your doctors office in the next 2 days. Call today to arrange your follow up. Take the medications as prescribed. Please review all of the medicines and only take them if you do not have an allergy to them. Please be aware that if you are taking birth control pills, taking other prescriptions, ESPECIALLY ANTIBIOTICS may make the birth control ineffective - if this is the case, either do not engage in sexual activity or use alternative methods of birth control such as condoms until you have finished the medicine and your family doctor says it is OK to restart them. If you are on a blood thinner such as COUMADIN, be aware that any other medicine that you take may cause the coumadin to either work too much, or not enough - you should have your coumadin level rechecked in next 7 days if this is the case.  ?  It is also a possibility that you have an allergic reaction to any of the medicines that you have been prescribed - Everybody reacts differently to medications and while MOST people have no trouble with most medicines, you may have a reaction such as nausea, vomiting, rash, swelling, shortness of breath. If this is the case, please stop taking the medicine immediately and contact your physician.  ?  You should return to the ER if you develop severe or worsening symptoms.

## 2014-09-26 NOTE — ED Notes (Signed)
Denies any numbness at this time

## 2014-10-02 ENCOUNTER — Telehealth: Payer: Self-pay | Admitting: *Deleted

## 2014-10-02 NOTE — Telephone Encounter (Signed)
Unable to reach patient at time of Pre-Visit Call.  Left message for patient to return call when available.    

## 2014-10-05 ENCOUNTER — Ambulatory Visit (HOSPITAL_BASED_OUTPATIENT_CLINIC_OR_DEPARTMENT_OTHER)
Admission: RE | Admit: 2014-10-05 | Discharge: 2014-10-05 | Disposition: A | Discharge status: Home / Self Care | Payer: 59 | Source: Ambulatory Visit | Attending: Medical | Admitting: Medical

## 2014-10-05 ENCOUNTER — Ambulatory Visit (INDEPENDENT_AMBULATORY_CARE_PROVIDER_SITE_OTHER): Payer: 59 | Admitting: Medical

## 2014-10-05 ENCOUNTER — Encounter: Payer: Self-pay | Admitting: Medical

## 2014-10-05 VITALS — BP 122/76 | HR 68 | Temp 97.8°F | Resp 16 | Ht 71.75 in | Wt 217.4 lb

## 2014-10-05 DIAGNOSIS — M7711 Lateral epicondylitis, right elbow: Secondary | ICD-10-CM

## 2014-10-05 DIAGNOSIS — G5601 Carpal tunnel syndrome, right upper limb: Secondary | ICD-10-CM | POA: Diagnosis not present

## 2014-10-05 DIAGNOSIS — R200 Anesthesia of skin: Secondary | ICD-10-CM | POA: Insufficient documentation

## 2014-10-05 DIAGNOSIS — M542 Cervicalgia: Secondary | ICD-10-CM

## 2014-10-05 DIAGNOSIS — K219 Gastro-esophageal reflux disease without esophagitis: Secondary | ICD-10-CM | POA: Diagnosis not present

## 2014-10-05 DIAGNOSIS — J3089 Other allergic rhinitis: Secondary | ICD-10-CM

## 2014-10-05 DIAGNOSIS — J309 Allergic rhinitis, unspecified: Secondary | ICD-10-CM | POA: Insufficient documentation

## 2014-10-05 MED ORDER — DICLOFENAC SODIUM 75 MG PO TBEC
75.0000 mg | DELAYED_RELEASE_TABLET | Freq: Two times a day (BID) | ORAL | Status: DC
Start: 2014-10-05 — End: 2014-11-30

## 2014-10-05 NOTE — Progress Notes (Signed)
Pre visit review using our clinic review tool, if applicable. No additional management support is needed unless otherwise documented below in the visit note. 

## 2014-10-05 NOTE — Patient Instructions (Addendum)
For your rt wrist and palm pain/numbness get wrist cock up splint. Use diclofenac.  For your elbow pain get tennis elbow splint and get xray of elbow.  For your neck pain will get cspine xray.  I will go ahead and refer you to sports medicine. Sports medicine may refer to other specialist if indicated.  Follow up in 3 wks or as needed

## 2014-10-05 NOTE — Progress Notes (Signed)
Subjective:    Patient ID: [name], male    DOB: [birth year], 54 y.o.   MRN: [id number]  HPI   I have reviewed pt PMH, PSH, FH, Social History and Surgical History.  Migraine ha- Diagnosed young adult  54 yo. Last saw neurologist about 1 yr ago. Pt states in past tried Triptans. They would help/make less severe. Pt tried topamax and did not prevent ha. Pt also saw Dr. Hyacinth Meeker neurologist in [facility]. Pt also may have been none neurontin. HA frequeny at time 2 times a week. But then other times will get go 3-4 weeks no migraine.  Pt may have sleep apnea. Trouble sleeping but he will not do sleeping study as neurologist recommended.  Some seasonal allergies-spring and fall. Some exercise induced asthma. Genella Rife- usually related to greasy foods.No medication daily basis.  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 No exercising. Moderate healthy diet. But some fast foods in am and red meat. Married- 3 children.  Pt states went to ED due to sharp electric sensation that went up his rt arm and then down to rt hip area. He was picking up stick in his yard and felt sharp pain. He states it felt like he was shocked. Since then he has numb sensation to palm on rt side. Also some slight pain in his rt eblow.  Also some position of resting his arm will cause will cause numb sensation. Then he will move his arm and stretch is out. Then the sensation resolves. Recently grabbing suitcase will cause transient numbness. Pt rt handed.  Also hx of severe elbow pain with throwing motion.  Some neck pain but no radicular symptoms to arm. But reports some crepitus sounds at times.    Review of Systems  Constitutional: Negative for fever, chills and diaphoresis.  Respiratory: Negative for cough, chest tightness, shortness of breath and wheezing.   Cardiovascular: Negative for chest pain and palpitations.  Musculoskeletal:       Rt elbow pain. Rt hand numbness at times see HPI.  Neurological: Positive  for weakness. Negative for dizziness, syncope, speech difficulty, numbness and headaches.  Hematological: Negative for adenopathy.  Psychiatric/Behavioral: Negative for behavioral problems and confusion.    Past Medical History  Diagnosis Date  . Migraines   . Low back pain   . Allergy   . Asthma   . GERD (gastroesophageal reflux disease)   . Sleep apnea     maybe but he declines sleep study.    Social History   Social History  . Marital Status: Married    Spouse Name: [name]  . Number of Children: 3  . Years of Education: 12   Occupational History  .      [employer]   Social History Main Topics  . Smoking status: Never Smoker   . Smokeless tobacco: Never Used  . Alcohol Use: No  . Drug Use: No  . Sexual Activity: Yes   Other Topics Concern  . Not on file   Social History Narrative   Patient lives at home with his wife. ([name]). Patient has some college.   Caffeine- two daily   Right handed          Past Surgical History  Procedure Laterality Date  . Hernia repair      1976  . Tonsillectomy      Family History  Problem Relation Age of Onset  . Alcoholism Father   . Diabetes Mother   . Diabetes Brother   .  High Cholesterol Brother   . High Cholesterol Mother     Allergies  Allergen Reactions  . Shellfish-Derived Products Anaphylaxis    Current Outpatient Prescriptions on File Prior to Visit  Medication Sig Dispense Refill  . Ibuprofen 200 MG CAPS Take by mouth 2 (two) times daily.     No current facility-administered medications on file prior to visit.    BP 122/76 mmHg  Pulse 68  Temp(Src) 97.8 F (36.6 C) (Oral)  Resp 16  Ht 5' 11.75" (1.822 m)  Wt 217 lb 6.4 oz (98.612 kg)  BMI 29.71 kg/m2  SpO2 97%       Objective:   Physical Exam  General- No acute distress. Pleasant patient. Neck- Full range of motion, no jvd Lungs- Clear, even and unlabored. Heart- regular rate and rhythm. Neurologic- CNII- XII grossly  intact.  Upper ext- easily induced phalen side rt wrist. Pulses intact. Sharp and dull discrimation.  Rt elbow- good rom. Lateral epicondyle pain tenderness to palpation.       Assessment & Plan:  For your rt wrist and palm pain/numbness get wrist cock up splint. Use diclofenac.  For your elbow pain get tennis elbow splint and get xray of elbow.  For your neck pain will get cspine xray.  I will go ahead and refer you to sports medicine. Sports medicine may refer to other specialist if indicated.  Follow up in 3 wks or as needed  No change to tx of chronic problems. Currenlty not symptomatic for chronic problems.

## 2014-10-09 ENCOUNTER — Ambulatory Visit: Payer: 59 | Admitting: Family Medicine

## 2014-10-12 ENCOUNTER — Ambulatory Visit (INDEPENDENT_AMBULATORY_CARE_PROVIDER_SITE_OTHER): Payer: 59 | Admitting: Family Medicine

## 2014-10-12 ENCOUNTER — Encounter: Payer: Self-pay | Admitting: Family Medicine

## 2014-10-12 ENCOUNTER — Ambulatory Visit: Payer: 59 | Admitting: Family Medicine

## 2014-10-12 VITALS — BP 128/87 | HR 76 | Ht 72.0 in | Wt 215.0 lb

## 2014-10-12 DIAGNOSIS — R200 Anesthesia of skin: Secondary | ICD-10-CM

## 2014-10-12 DIAGNOSIS — M25511 Pain in right shoulder: Secondary | ICD-10-CM | POA: Diagnosis not present

## 2014-10-12 DIAGNOSIS — R202 Paresthesia of skin: Secondary | ICD-10-CM | POA: Diagnosis not present

## 2014-10-12 NOTE — Patient Instructions (Signed)
We will refer you to Dr. Marlis Edelson office for further evaluation. Your history and exam do not fit with an irritated nerve from a bulging/herniated disc or a typical peripheral neuropathy. 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 Consider physical therapy with transition to home exercise program. Do home exercise program with theraband and scapular stabilization exercises daily - these are very important for long term relief even if an injection was given.  3 sets of 10 once a day. Follow up with me as needed.

## 2014-10-14 DIAGNOSIS — R200 Anesthesia of skin: Secondary | ICD-10-CM | POA: Insufficient documentation

## 2014-10-14 DIAGNOSIS — R202 Paresthesia of skin: Secondary | ICD-10-CM

## 2014-10-14 DIAGNOSIS — M25511 Pain in right shoulder: Secondary | ICD-10-CM | POA: Insufficient documentation

## 2014-10-14 DIAGNOSIS — J45909 Unspecified asthma, uncomplicated: Secondary | ICD-10-CM | POA: Insufficient documentation

## 2014-10-14 NOTE — Assessment & Plan Note (Signed)
Right arm and torso pain, numbness - Unusual presentation.  Musculoskeletal exam is benign of right shoulder and neck (has some underlying instability but this would not cause his symptoms).  Distribution is not consistent with a disc herniation or peripheral neuropathy (again, exam normal except for decreased sensation).  Advised this is most consistent with a neurologic issue - recommended we go ahead with neurology referral.

## 2014-10-14 NOTE — Addendum Note (Signed)
Addended by: Kathi Simpers F on: 10/14/2014 03:49 PM   Modules accepted: Orders

## 2014-10-14 NOTE — Assessment & Plan Note (Signed)
Right shoulder multidirectional instability - shown home exercises to do daily.  Consider physical therapy if not improving.

## 2014-10-14 NOTE — Progress Notes (Signed)
PCP and referred by: Esperanza Richters, PA-C  Subjective:   HPI: Patient is a 54 y.o. male here for right arm pain.  Patient reports about 1 1/2 weeks ago he was outside when he had a sudden sensation in entire right arm and right torso of being electrocuted. Did not step on anything, no injury. Reports severity of this lasted for a couple minutes though continues to have a numb sensation in this distribution, especially in his right palm. No prior issues. Went to ED - had CT of head that was negative. No vision, speech changes, other complaints.  Past Medical History  Diagnosis Date  . Migraines   . Low back pain   . Allergy   . Asthma   . GERD (gastroesophageal reflux disease)   . Sleep apnea     maybe but he declines sleep study.    Current Outpatient Prescriptions on File Prior to Visit  Medication Sig Dispense Refill  . diclofenac (VOLTAREN) 75 MG EC tablet Take 1 tablet (75 mg total) by mouth 2 (two) times daily. 30 tablet 0  . Ibuprofen 200 MG CAPS Take by mouth 2 (two) times daily.     No current facility-administered medications on file prior to visit.    Past Surgical History  Procedure Laterality Date  . Hernia repair      1976  . Tonsillectomy      Allergies  Allergen Reactions  . Shellfish-Derived Products Anaphylaxis    Social History   Social History  . Marital Status: Married    Spouse Name: [name]  . Number of Children: 3  . Years of Education: 12   Occupational History  .      [employer]   Social History Main Topics  . Smoking status: Never Smoker   . Smokeless tobacco: Never Used  . Alcohol Use: No  . Drug Use: No  . Sexual Activity: Yes   Other Topics Concern  . Not on file   Social History Narrative   Patient lives at home with his wife. ([name]). Patient has some college.   Caffeine- two daily   Right handed          Family History  Problem Relation Age of Onset  . Alcoholism Father   . Diabetes Mother   . Diabetes  Brother   . High Cholesterol Brother   . High Cholesterol Mother     BP 128/87 mmHg  Pulse 76  Ht 6' (1.829 m)  Wt 215 lb (97.523 kg)  BMI 29.15 kg/m2  Review of Systems: See HPI above.    Objective:  Physical Exam:  Gen: NAD  Right shoulder: No swelling, ecchymoses.  No gross deformity. No TTP. FROM without pain. Negative Hawkins, Neers. Strength 5/5 with empty can and resisted internal/external rotation. Negative apprehension. Positive sulcus sign. NV intact distally.  Neck: No gross deformity, swelling, bruising. No TTP. FROM neck without pain . BUE strength 5/5.   Sensation diminished right thumb and lateral forearm. 2+ equal reflexes in triceps, biceps, brachioradialis tendons. Negative spurlings.    Assessment & Plan:  1. Right arm and torso pain, numbness - Unusual presentation.  Musculoskeletal exam is benign of right shoulder and neck (has some underlying instability but this would not cause his symptoms).  Distribution is not consistent with a disc herniation or peripheral neuropathy (again, exam normal except for decreased sensation).  Advised this is most consistent with a neurologic issue - recommended we go ahead with neurology referral.  2.  Right shoulder multidirectional instability - shown home exercises to do daily.  Consider physical therapy if not improving.

## 2014-11-18 ENCOUNTER — Ambulatory Visit: Payer: 59 | Admitting: Neurology

## 2014-11-18 NOTE — Telephone Encounter (Signed)
Error

## 2014-11-30 ENCOUNTER — Encounter: Payer: Self-pay | Admitting: Neurology

## 2014-11-30 ENCOUNTER — Ambulatory Visit (INDEPENDENT_AMBULATORY_CARE_PROVIDER_SITE_OTHER): Payer: 59 | Admitting: Neurology

## 2014-11-30 ENCOUNTER — Encounter (INDEPENDENT_AMBULATORY_CARE_PROVIDER_SITE_OTHER): Payer: Self-pay

## 2014-11-30 VITALS — BP 135/85 | HR 97 | Ht 72.0 in | Wt 216.2 lb

## 2014-11-30 DIAGNOSIS — R202 Paresthesia of skin: Secondary | ICD-10-CM

## 2014-11-30 DIAGNOSIS — R200 Anesthesia of skin: Secondary | ICD-10-CM | POA: Insufficient documentation

## 2014-11-30 DIAGNOSIS — R51 Headache: Secondary | ICD-10-CM

## 2014-11-30 DIAGNOSIS — G43019 Migraine without aura, intractable, without status migrainosus: Secondary | ICD-10-CM | POA: Insufficient documentation

## 2014-11-30 DIAGNOSIS — R29898 Other symptoms and signs involving the musculoskeletal system: Secondary | ICD-10-CM

## 2014-11-30 DIAGNOSIS — R519 Headache, unspecified: Secondary | ICD-10-CM | POA: Insufficient documentation

## 2014-11-30 MED ORDER — TIZANIDINE HCL 2 MG PO CAPS: 2.0000 mg | ORAL_CAPSULE | Freq: Every evening | ORAL | Status: DC | PRN
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
## 2014-11-30 NOTE — Patient Instructions (Signed)
I had a long discussion with the patient with regards to his episode of right upper extremity paresthesias and weakness and discussed differential diagnosis and need for obtaining brain imaging and further workup. I recommend he start taking aspirin 81 mg daily and check fasting lipid profile, hemoglobin A1c and MRI scan of the brain. Further testing will be initiated based on the results of about test. Also recommend he do regular neck stretching exercises as well as start taking Zanaflex 2 mg at night to help with his headaches. I have advised him to add back and reduce ibuprofen daily usage to avoid analgesic rebound .We will consider Botox injections after insurance approval. He was advised to keep his headache diary. He will return for follow-up in the future of her Botox approval or in 2 months and call earlier if necessary  Tension Headache A tension headache is a feeling of pain, pressure, or aching that is often felt over the front and sides of the head. The pain can be dull, or it can feel tight (constricting). Tension headaches are not normally associated with nausea or vomiting, and they do not get worse with physical activity. Tension headaches can last from 30 minutes to several days. This is the most common type of headache. CAUSES The exact cause of this condition is not known. Tension headaches often begin after stress, anxiety, or depression. Other triggers may include:  Alcohol.  Too much caffeine, or caffeine withdrawal.  Respiratory infections, such as colds, flu, or sinus infections.  Dental problems or teeth clenching.  Fatigue.  Holding your head and neck in the same position for a long period of time, such as while using a computer.  Smoking. SYMPTOMS Symptoms of this condition include:  A feeling of pressure around the head.  Dull, aching head pain.  Pain felt over the front and sides of the head.  Tenderness in the muscles of the head, neck, and  shoulders. DIAGNOSIS This condition may be diagnosed based on your symptoms and a physical exam. Tests may be done, such as a CT scan or an MRI of your head. These tests may be done if your symptoms are severe or unusual. TREATMENT This condition may be treated with lifestyle changes and medicines to help relieve symptoms. HOME CARE INSTRUCTIONS Managing Pain  Take over-the-counter and prescription medicines only as told by your health care provider.  Lie down in a dark, quiet room when you have a headache.  If directed, apply ice to the head and neck area:  Put ice in a plastic bag.  Place a towel between your skin and the bag.  Leave the ice on for 20 minutes, 2-3 times per day.  Use a heating pad or a hot shower to apply heat to the head and neck area as told by your health care provider. Eating and Drinking  Eat meals on a regular schedule.  Limit alcohol use.  Decrease your caffeine intake, or stop using caffeine. General Instructions  Keep all follow-up visits as told by your health care provider. This is important.  Keep a headache journal to help find out what may trigger your headaches. For example, write down:  What you eat and drink.  How much sleep you get.  Any change to your diet or medicines.  Try massage or other relaxation techniques.  Limit stress.  Sit up straight, and avoid tensing your muscles.  Do not use tobacco products, including cigarettes, chewing tobacco, or e-cigarettes. If you need help quitting,  ask your health care provider.  Exercise regularly as told by your health care provider.  Get 7-9 hours of sleep, or the amount recommended by your health care provider. SEEK MEDICAL CARE IF:  Your symptoms are not helped by medicine.  You have a headache that is different from what you normally experience.  You have nausea or you vomit.  You have a fever. SEEK IMMEDIATE MEDICAL CARE IF:  Your headache becomes severe.  You have  repeated vomiting.  You have a stiff neck.  You have a loss of vision.  You have problems with speech.  You have pain in your eye or ear.  You have muscular weakness or loss of muscle control.  You lose your balance or you have trouble walking.  You feel faint or you pass out.  You have confusion.   This information is not intended to replace advice given to you by your health care provider. Make sure you discuss any questions you have with your health care provider.   Document Released: 01/09/2005 Document Revised: 09/30/2014 Document Reviewed: 05/04/2014 Elsevier Interactive Patient Education [employer] Elsevier Inc.

## 2015-01-28 ENCOUNTER — Emergency Department (HOSPITAL_COMMUNITY)
Admission: EM | Admit: 2015-01-28 | Discharge: 2015-01-28 | Disposition: A | Discharge status: Home / Self Care | Payer: Worker's Compensation | Attending: Emergency Medicine | Admitting: Emergency Medicine

## 2015-01-28 ENCOUNTER — Encounter (HOSPITAL_COMMUNITY): Payer: Self-pay | Admitting: Emergency Medicine

## 2015-01-28 DIAGNOSIS — Z8679 Personal history of other diseases of the circulatory system: Secondary | ICD-10-CM | POA: Diagnosis not present

## 2015-01-28 DIAGNOSIS — Z791 Long term (current) use of non-steroidal anti-inflammatories (NSAID): Secondary | ICD-10-CM | POA: Insufficient documentation

## 2015-01-28 DIAGNOSIS — M542 Cervicalgia: Secondary | ICD-10-CM

## 2015-01-28 DIAGNOSIS — Z8669 Personal history of other diseases of the nervous system and sense organs: Secondary | ICD-10-CM | POA: Insufficient documentation

## 2015-01-28 DIAGNOSIS — Y92410 Unspecified street and highway as the place of occurrence of the external cause: Secondary | ICD-10-CM | POA: Insufficient documentation

## 2015-01-28 DIAGNOSIS — M545 Low back pain, unspecified: Secondary | ICD-10-CM

## 2015-01-28 DIAGNOSIS — Y998 Other external cause status: Secondary | ICD-10-CM | POA: Diagnosis not present

## 2015-01-28 DIAGNOSIS — Z8719 Personal history of other diseases of the digestive system: Secondary | ICD-10-CM | POA: Diagnosis not present

## 2015-01-28 DIAGNOSIS — S199XXA Unspecified injury of neck, initial encounter: Secondary | ICD-10-CM | POA: Diagnosis not present

## 2015-01-28 DIAGNOSIS — S4992XA Unspecified injury of left shoulder and upper arm, initial encounter: Secondary | ICD-10-CM | POA: Diagnosis not present

## 2015-01-28 DIAGNOSIS — J45909 Unspecified asthma, uncomplicated: Secondary | ICD-10-CM | POA: Insufficient documentation

## 2015-01-28 DIAGNOSIS — S3992XA Unspecified injury of lower back, initial encounter: Secondary | ICD-10-CM | POA: Insufficient documentation

## 2015-01-28 DIAGNOSIS — M79602 Pain in left arm: Secondary | ICD-10-CM

## 2015-01-28 DIAGNOSIS — Y9389 Activity, other specified: Secondary | ICD-10-CM | POA: Insufficient documentation

## 2015-01-28 MED ORDER — NAPROXEN 250 MG PO TABS: 250.0000 mg | ORAL_TABLET | Freq: Two times a day (BID) | ORAL | Status: DC

## 2015-01-28 MED ORDER — METHOCARBAMOL 500 MG PO TABS: 500.0000 mg | ORAL_TABLET | Freq: Two times a day (BID) | ORAL | Status: DC | PRN
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
## 2015-01-28 NOTE — Discharge Instructions (Signed)
Back Exercises °The following exercises strengthen the muscles that help to support the back. They also help to keep the lower back flexible. Doing these exercises can help to prevent back pain or lessen existing pain. °If you have back pain or discomfort, try doing these exercises 2-3 times each day or as told by your health care provider. When the pain goes away, do them once each day, but increase the number of times that you repeat the steps for each exercise (do more repetitions). If you do not have back pain or discomfort, do these exercises once each day or as told by your health care provider. °EXERCISES °Single Knee to Chest °Repeat these steps 3-5 times for each leg: °· Lie on your back on a firm bed or the floor with your legs extended. °· Bring one knee to your chest. Your other leg should stay extended and in contact with the floor. °· Hold your knee in place by grabbing your knee or thigh. °· Pull on your knee until you feel a gentle stretch in your lower back. °· Hold the stretch for 10-30 seconds. °· Slowly release and straighten your leg. °Pelvic Tilt °Repeat these steps 5-10 times: °· Lie on your back on a firm bed or the floor with your legs extended. °· Bend your knees so they are pointing toward the ceiling and your feet are flat on the floor. °· Tighten your lower abdominal muscles to press your lower back against the floor. This motion will tilt your pelvis so your tailbone points up toward the ceiling instead of pointing to your feet or the floor. °· With gentle tension and even breathing, hold this position for 5-10 seconds. °Cat-Cow °Repeat these steps until your lower back becomes more flexible: °· Get into a hands-and-knees position on a firm surface. Keep your hands under your shoulders, and keep your knees under your hips. You may place padding under your knees for comfort. °· Let your head hang down, and point your tailbone toward the floor so your lower back becomes rounded like the  back of a cat. °· Hold this position for 5 seconds. °· Slowly lift your head and point your tailbone up toward the ceiling so your back forms a sagging arch like the back of a cow. °· Hold this position for 5 seconds. °Press-Ups °Repeat these steps 5-10 times: °· Lie on your abdomen (face-down) on the floor. °· Place your palms near your head, about shoulder-width apart. °· While you keep your back as relaxed as possible and keep your hips on the floor, slowly straighten your arms to raise the top half of your body and lift your shoulders. Do not use your back muscles to raise your upper torso. You may adjust the placement of your hands to make yourself more comfortable. °· Hold this position for 5 seconds while you keep your back relaxed. °· Slowly return to lying flat on the floor. °Bridges °Repeat these steps 10 times: °1. Lie on your back on a firm surface. °2. Bend your knees so they are pointing toward the ceiling and your feet are flat on the floor. °3. Tighten your buttocks muscles and lift your buttocks off of the floor until your waist is at almost the same height as your knees. You should feel the muscles working in your buttocks and the back of your thighs. If you do not feel these muscles, slide your feet 1-2 inches farther away from your buttocks. °4. Hold this position for 3-5   seconds. °5. Slowly lower your hips to the starting position, and allow your buttocks muscles to relax completely. °If this exercise is too easy, try doing it with your arms crossed over your chest. °Abdominal Crunches °Repeat these steps 5-10 times: °1. Lie on your back on a firm bed or the floor with your legs extended. °2. Bend your knees so they are pointing toward the ceiling and your feet are flat on the floor. °3. Cross your arms over your chest. °4. Tip your chin slightly toward your chest without bending your neck. °5. Tighten your abdominal muscles and slowly raise your trunk (torso) high enough to lift your shoulder  blades a tiny bit off of the floor. Avoid raising your torso higher than that, because it can put too much stress on your low back and it does not help to strengthen your abdominal muscles. °6. Slowly return to your starting position. °Back Lifts °Repeat these steps 5-10 times: °1. Lie on your abdomen (face-down) with your arms at your sides, and rest your forehead on the floor. °2. Tighten the muscles in your legs and your buttocks. °3. Slowly lift your chest off of the floor while you keep your hips pressed to the floor. Keep the back of your head in line with the curve in your back. Your eyes should be looking at the floor. °4. Hold this position for 3-5 seconds. °5. Slowly return to your starting position. °SEEK MEDICAL CARE IF: °· Your back pain or discomfort gets much worse when you do an exercise. °· Your back pain or discomfort does not lessen within 2 hours after you exercise. °If you have any of these problems, stop doing these exercises right away. Do not do them again unless your health care provider says that you can. °SEEK IMMEDIATE MEDICAL CARE IF: °· You develop sudden, severe back pain. If this happens, stop doing the exercises right away. Do not do them again unless your health care provider says that you can. °  °This information is not intended to replace advice given to you by your health care provider. Make sure you discuss any questions you have with your health care provider. °  °Document Released: 02/17/2004 Document Revised: 09/30/2014 Document Reviewed: 03/05/2014 °Elsevier Interactive Patient Education ©2016 Elsevier Inc. ° °Back Pain, Adult °Back pain is very common in adults. The cause of back pain is rarely dangerous and the pain often gets better over time. The cause of your back pain may not be known. Some common causes of back pain include: °· Strain of the muscles or ligaments supporting the spine. °· Wear and tear (degeneration) of the spinal disks. °· Arthritis. °· Direct injury  to the back. °For many people, back pain may return. Since back pain is rarely dangerous, most people can learn to manage this condition on their own. °HOME CARE INSTRUCTIONS °Watch your back pain for any changes. The following actions may help to lessen any discomfort you are feeling: °· Remain active. It is stressful on your back to sit or stand in one place for long periods of time. Do not sit, drive, or stand in one place for more than 30 minutes at a time. Take short walks on even surfaces as soon as you are able. Try to increase the length of time you walk each day. °· Exercise regularly as directed by your health care provider. Exercise helps your back heal faster. It also helps avoid future injury by keeping your muscles strong and flexible. °· Do not stay in   bed. Resting more than 1-2 days can delay your recovery. °· Pay attention to your body when you bend and lift. The most comfortable positions are those that put less stress on your recovering back. Always use proper lifting techniques, including: °· Bending your knees. °· Keeping the load close to your body. °· Avoiding twisting. °· Find a comfortable position to sleep. Use a firm mattress and lie on your side with your knees slightly bent. If you lie on your back, put a pillow under your knees. °· Avoid feeling anxious or stressed. Stress increases muscle tension and can worsen back pain. It is important to recognize when you are anxious or stressed and learn ways to manage it, such as with exercise. °· Take medicines only as directed by your health care provider. Over-the-counter medicines to reduce pain and inflammation are often the most helpful. Your health care provider may prescribe muscle relaxant drugs. These medicines help dull your pain so you can more quickly return to your normal activities and healthy exercise. °· Apply ice to the injured area: °· Put ice in a plastic bag. °· Place a towel between your skin and the bag. °· Leave the ice on  for 20 minutes, 2-3 times a day for the first 2-3 days. After that, ice and heat may be alternated to reduce pain and spasms. °· Maintain a healthy weight. Excess weight puts extra stress on your back and makes it difficult to maintain good posture. °SEEK MEDICAL CARE IF: °· You have pain that is not relieved with rest or medicine. °· You have increasing pain going down into the legs or buttocks. °· You have pain that does not improve in one week. °· You have night pain. °· You lose weight. °· You have a fever or chills. °SEEK IMMEDIATE MEDICAL CARE IF:  °· You develop new bowel or bladder control problems. °· You have unusual weakness or numbness in your arms or legs. °· You develop nausea or vomiting. °· You develop abdominal pain. °· You feel faint. °  °This information is not intended to replace advice given to you by your health care provider. Make sure you discuss any questions you have with your health care provider. °  °Document Released: 01/09/2005 Document Revised: 01/30/2014 Document Reviewed: 05/13/2013 °Elsevier Interactive Patient Education ©2016 Elsevier Inc. ° °Motor Vehicle Collision °It is common to have multiple bruises and sore muscles after a motor vehicle collision (MVC). These tend to feel worse for the first 24 hours. You may have the most stiffness and soreness over the first several hours. You may also feel worse when you wake up the first morning after your collision. After this point, you will usually begin to improve with each day. The speed of improvement often depends on the severity of the collision, the number of injuries, and the location and nature of these injuries. °HOME CARE INSTRUCTIONS °· Put ice on the injured area. °¨ Put ice in a plastic bag. °¨ Place a towel between your skin and the bag. °¨ Leave the ice on for 15-20 minutes, 3-4 times a day, or as directed by your health care provider. °· Drink enough fluids to keep your urine clear or pale yellow. Do not drink  alcohol. °· Take a warm shower or bath once or twice a day. This will increase blood flow to sore muscles. °· You may return to activities as directed by your caregiver. Be careful when lifting, as this may aggravate neck or back pain. °· Only take over-the-counter or prescription   medicines for pain, discomfort, or fever as directed by your caregiver. Do not use aspirin. This may increase bruising and bleeding. °SEEK IMMEDIATE MEDICAL CARE IF: °· You have numbness, tingling, or weakness in the arms or legs. °· You develop severe headaches not relieved with medicine. °· You have severe neck pain, especially tenderness in the middle of the back of your neck. °· You have changes in bowel or bladder control. °· There is increasing pain in any area of the body. °· You have shortness of breath, light-headedness, dizziness, or fainting. °· You have chest pain. °· You feel sick to your stomach (nauseous), throw up (vomit), or sweat. °· You have increasing abdominal discomfort. °· There is blood in your urine, stool, or vomit. °· You have pain in your shoulder (shoulder strap areas). °· You feel your symptoms are getting worse. °MAKE SURE YOU: °· Understand these instructions. °· Will watch your condition. °· Will get help right away if you are not doing well or get worse. °  °This information is not intended to replace advice given to you by your health care provider. Make sure you discuss any questions you have with your health care provider. °  °Document Released: 01/09/2005 Document Revised: 01/30/2014 Document Reviewed: 06/08/2010 °Elsevier Interactive Patient Education ©2016 Elsevier Inc. ° °
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
## 2015-03-03 ENCOUNTER — Telehealth: Payer: Self-pay | Admitting: Medical

## 2015-03-03 NOTE — Telephone Encounter (Signed)
LM for pt to call and schedule flu shot or update records. °

## 2015-05-19 ENCOUNTER — Telehealth: Payer: Self-pay | Admitting: Neurology

## 2015-05-19 ENCOUNTER — Encounter: Payer: Self-pay | Admitting: Neurology

## 2015-05-19 NOTE — Telephone Encounter (Signed)
Spoke to patient - he is requesting a letter stating he is up frequently during the night so his employer will approve a single hotel room at his upcoming conference.  He last saw Dr. Terrace ArabiaYan in 03/2013 - she ordered a sleep study and the patient never had the test.  More recently, he had an office visit with Dr. Pearlean BrownieSethi (11/2014).  The patient is requesting Dr. Pearlean BrownieSethi to provide the note.  He is aware we will check with him and return the call.

## 2015-05-19 NOTE — Telephone Encounter (Signed)
Pt said he needs a note for work that states he has sleep apnea, difficulty sleeping, he gets up during the night an watches TV. He will be going to [place] leaving on 05/23/15 (Sunday) and work wants him to share a room. His supervisor advised him if he could get a note from provider he could get a single room. Pt is sending request thru mychart also.

## 2015-05-20 NOTE — Telephone Encounter (Signed)
Pt called back inquiring about the note. Please call

## 2015-05-21 ENCOUNTER — Encounter: Payer: Self-pay | Admitting: Neurology

## 2015-05-21 NOTE — Telephone Encounter (Signed)
Patient called back to check status of note that was requested 4/26.

## 2015-05-21 NOTE — Telephone Encounter (Signed)
Patient requests call back regarding letter. States letter has to be turned in at his work today.

## 2015-05-21 NOTE — Telephone Encounter (Addendum)
Rn call patient back about the note for his job to have his own room. Pt states he snores and is up and down all night and does not want to inconvenient his room mate. Pt stated [facility]. Rn stated Dr.Sethi reviewed his chart and will not do the letter at this time. Pt never went to an appt for the sleep consult. . Pt became upset" I just want a letter that we discuss this, its not fair. Rn stated Dr. Pearlean BrownieSethi will be sent a message and he can discuss it with him. Pt verbalized understanding.

## 2015-05-24 NOTE — Telephone Encounter (Signed)
Dr.Sethi sent patient a mychart message regarding the letter issue.

## 2015-05-24 NOTE — Telephone Encounter (Signed)
Message sent to Dr.Sethi to contact patient.

## 2015-05-24 NOTE — Telephone Encounter (Signed)
I called his number but got no reply and  left a message on the patient's answering machine to  call me back about his letter requestk

## 2015-08-19 ENCOUNTER — Telehealth: Payer: Self-pay | Admitting: Neurology

## 2015-08-19 NOTE — Telephone Encounter (Signed)
Rn call patient about his right sided numbness. PT stated he feels better now  PT stated he never got the MRI because he could not afford the co payment of 800 to 1000. Pt states he really needs to get one. Pt was schedule with Dr. Pearlean Brownie on 09/07/2015 the next available. Rn stated if his numbness gets worse to seek the nearest ED. PT was seen for numbness in 11/2014 as a new patient. PT verbalized understanding.

## 2015-08-19 NOTE — Telephone Encounter (Signed)
Patient called to advise, he's having numbness right side again, what should he do about it?

## 2015-08-20 NOTE — Telephone Encounter (Signed)
Rn call patient back his nose bleed sometimes. Pt is not on any blood thinners. Rn advise him to see his PCP for the nose bleed. Pt states he would like to be on a waiting list. RN stated if he would like to be seen sooner he can see the NP because he had the problem last year. PT stated he will call next week and see which NP can see him and order a MRI. Pt verbalized understanding. Rn stated if he gets a sooner appt before 8/15 to cancel that appt. PT verbalized understanding.

## 2015-08-20 NOTE — Telephone Encounter (Signed)
Pt called in about his appt of Aug 18th requesting to be added to wait list. He complains he can even throw a ball without excruciating pain. He states nothing has changed. Pt also has mentioned a blood nose frequently when blowing his nose. Please call if someone cancels.

## 2015-09-07 ENCOUNTER — Ambulatory Visit (INDEPENDENT_AMBULATORY_CARE_PROVIDER_SITE_OTHER): Payer: 59 | Admitting: Neurology

## 2015-09-07 ENCOUNTER — Encounter: Payer: Self-pay | Admitting: Neurology

## 2015-09-07 VITALS — BP 122/81 | HR 90 | Ht 72.0 in | Wt 223.2 lb

## 2015-09-07 DIAGNOSIS — M25831 Other specified joint disorders, right wrist: Secondary | ICD-10-CM
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
## 2015-09-07 NOTE — Patient Instructions (Signed)
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 Cervical radiculopathy is possible less likely. I encouraged him to wear sling to rest his right elbow and to use a pillow beneath his right elbow while sleeping at night. Check EMG nerve conduction study. Continue aspirin for stroke prevention and no regular neck stretching exercises to help (some echogenic tension headache. The patient cannot afford MRI scan and the present time due to financial reasons. Similarly cannot afford his co-pay for medications for restless legs. She will return for follow-up in 3 months with nurse practitioner call earlier if necessary.

## 2015-10-04 ENCOUNTER — Telehealth: Payer: Self-pay | Admitting: Neurology

## 2015-10-04 NOTE — Telephone Encounter (Signed)
FYI-Pt is having to cx the test as everything goes towards a high Deductible and pt cannot afford it at this time.

## 2015-10-05 NOTE — Telephone Encounter (Signed)
ok 

## 2015-10-15 ENCOUNTER — Encounter: Payer: 59 | Admitting: Neurology

## 2016-01-07 ENCOUNTER — Ambulatory Visit (INDEPENDENT_AMBULATORY_CARE_PROVIDER_SITE_OTHER): Payer: 59 | Admitting: Allergy & Immunology

## 2016-01-07 ENCOUNTER — Encounter: Payer: Self-pay | Admitting: Allergy & Immunology

## 2016-01-07 VITALS — BP 136/90 | HR 92 | Temp 98.7°F | Resp 28 | Ht 71.0 in | Wt 215.0 lb

## 2016-01-07 DIAGNOSIS — J300 Vasomotor rhinitis: Secondary | ICD-10-CM

## 2016-01-07 DIAGNOSIS — J4551 Severe persistent asthma with (acute) exacerbation: Secondary | ICD-10-CM | POA: Diagnosis not present

## 2016-01-07 DIAGNOSIS — Z5181 Encounter for therapeutic drug level monitoring: Secondary | ICD-10-CM | POA: Diagnosis not present

## 2016-01-07 DIAGNOSIS — T781XXD Other adverse food reactions, not elsewhere classified, subsequent encounter: Secondary | ICD-10-CM

## 2016-01-07 DIAGNOSIS — K219 Gastro-esophageal reflux disease without esophagitis: Secondary | ICD-10-CM | POA: Diagnosis not present

## 2016-01-07 DIAGNOSIS — Z791 Long term (current) use of non-steroidal anti-inflammatories (NSAID): Secondary | ICD-10-CM

## 2016-01-07 MED ORDER — EPINEPHRINE 0.3 MG/0.3ML IJ SOAJ: 0.3000 mg | Freq: Once | INTRAMUSCULAR | 2 refills | Status: AC

## 2016-01-07 MED ORDER — BUDESONIDE-FORMOTEROL FUMARATE 160-4.5 MCG/ACT IN AERO: 2.0000 | INHALATION_SPRAY | Freq: Two times a day (BID) | RESPIRATORY_TRACT | 5 refills | Status: AC

## 2016-01-07 MED ORDER — ALBUTEROL SULFATE HFA 108 (90 BASE) MCG/ACT IN AERS: 2.0000 | INHALATION_SPRAY | RESPIRATORY_TRACT | 5 refills | Status: AC | PRN

## 2016-01-07 MED ORDER — FLUTICASONE PROPIONATE 50 MCG/ACT NA SUSP: 2.0000 | Freq: Every day | NASAL | 5 refills | Status: AC

## 2016-01-07 NOTE — Patient Instructions (Signed)
1. Severe persistent asthma with acute exacerbation - Lung function testing today showed moderate-severe restrictive disease which was reversible with the breathing treatment. - Since you have been coughing for three months, we will start a combined inhaled steroid and long-acting albuterol: Symbicort 160/4.5 two puffs twice daily with the spacer. - You would likely benefit from a more prolonged course of prednisone, however I can understand your hesitation to do another round. - In place of additional prednisone, we will give you a sample of an inhaled steroid (Qvar) to use in addition to your Symbicort for a couple of weeks. - Daily controller medication(s): Symbicort 160/4.5 two puffs twice daily - Rescue medications: ProAir 4 puffs every 4-6 hours as needed - Changes during respiratory infections or worsening symptoms: start Qvar 80mcg 4 puffs twice daily for TWO WEEKS. - Asthma control goals:  * Full participation in all desired activities (may need albuterol before activity) * Albuterol use two time or less a week on average (not counting use with activity) * Cough interfering with sleep two time or less a month * Oral steroids no more than once a year * No hospitalizations  2. Chronic vasomotor rhinitis - Start Flonase two sprays per nostril daily. - Start Zyrtec (cetirizine) 10mg  daily. - If you are interested in the future, we can evaluate your allergies at future visits.  3. Seafood allergy - We will send in a prescription for AuviQ which is an epinephrine auto-injector. - This will be delivered to your home for free. - Take this with you at all times in case you are exposed to seafood unknowingly.  4. Return in about 4 weeks (around 02/04/2016).  Please inform us of any Emergency Department visits, hospitalizations, or changes in symptoms. Call us before going to the ED for breathing or allergy symptoms since we might be able to fit you in for a sick visit. Feel free to contact  us anytime with any questions, problems, or concerns.  It was a pleasure to meet you today! Have a wonderful holiday season!   Websites that have reliable patient information: 1. American Academy of Asthma, Allergy, and Immunology: www.aaaai.org 2. Food Allergy Research and Education (FARE): foodallergy.org 3. Mothers of Asthmatics: http://www.asthmacommunitynetwork.org 4. American College of Allergy, Asthma, and Immunology: www.acaai.org

## 2016-01-07 NOTE — Progress Notes (Signed)
NEW PATIENT  Date of Service/Encounter:  01/07/16   Assessment:   Severe persistent asthma with acute exacerbation  Chronic non-allergic rhinitis  Gastroesophageal reflux disease  Chronic NSAID therapy   Asthma Reportables:  Severity: severe persistent  Risk: high Control: not well controlled  Seasonal Influenza Vaccine: yes    Plan/Recommendations:   1. Severe persistent asthma with acute exacerbation - with moderate/severe restrictive lung disease and history of limited asbestos exposure - Lung function testing today showed moderate-severe restrictive disease which was reversible with the breathing treatment (25% increased in the FEV1) - Since [name] has had coughing for three months, we will start a combined ICS/LABA: Symbicort 160/4.5 two puffs twice daily with the spacer. - [name] would likely benefit from a more prolonged course of prednisone, however [name] is hesitant to take prednisone as his ophthalmologist has told him that it would worsen his glaucoma. - [name] does have a course of prednisone at the pharmacy (from the Urgent Care visit six weeks ago) that he could fill if his symptoms worsen despite this new therapy.  - In place of additional prednisone, we will give you a sample of an inhaled steroid (Qvar) to use in addition to your Symbicort for a couple of weeks. - Daily controller medication(s): Symbicort 160/4.5 two puffs twice daily - Rescue medications: ProAir 4 puffs every 4-6 hours as needed - Changes during respiratory infections or worsening symptoms: start Qvar 80mcg 4 puffs twice daily for TWO WEEKS. - Asthma control goals:  * Full participation in all desired activities (may need albuterol before activity) * Albuterol use two time or less a week on average (not counting use with activity) * Cough interfering with sleep two time or less a month * Oral steroids no more than once a year * No hospitalizations - If [name] continues to  have restrictive lung disease at the next visit, we will strongly consider a chest CT and full pulmonary functioning testing with DLCO.  - The history of asbestos exposure is concerning for mesothelioma, however he only was exposed for three summers when he was a child. - Another consideration is occupational asthma (although this would not present with restrictive disease), as he is around fumes and other inhalants much of the day. - Interstitial lung disease is another consideration, possibly triggered by an occupational inhalant of some sort.   2. Chronic vasomotor rhinitis - Start Flonase two sprays per nostril daily. - Start Zyrtec (cetirizine) 10mg  daily. - If you are interested in the future, we can evaluate your allergies at future visits. - [name] did not want to incur the cost of testing at this time.   3. Seafood allergy - We sent in a prescription for AuviQ to have as needed. - This is particular ideal for [name] since he is concerned with costs. - Reviewed s/s anaphylaxis including indications for when to use the AuviQ.   4. Reflux - I recommended that [name] take a PPI on a daily basis given his current control of his respiratory symptoms. - He is also on chronic NSAID therapy for back pain, therefore he would benefit from a chronic PPI with regards to gastric protection.   5. Return in about 4 weeks (around 02/04/2016).   Subjective:   [name] is a 55 y.o. male presenting today for evaluation of  Chief Complaint  Patient presents with  . Cough    finished prednisone, on doxycycline   . Shortness  of Breath   [name] has a history of the following: Patient Active Problem List   Diagnosis Date Noted  . Right arm weakness 11/30/2014  . Numbness and tingling in right hand 11/30/2014  . 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 Chronic daily headache 11/30/2014  . Airway hyperreactivity 10/14/2014  . Shoulder pain, right 10/14/2014  .  Numbness and tingling 10/14/2014  . GERD (gastroesophageal reflux disease) 10/05/2014  . Allergic rhinitis 10/05/2014  . FOM (frequency of micturition) 05/29/2014  . Obstructive sleep apnea 08/15/2012  . Migraines   . Low back pain     History obtained from: chart review and patient.  [name] was referred by No primary care provider on file.Marland Kitchen     [name] is a 55 y.o. male presenting for evaluation of shortness of breath. He has a prolonged history of asthma that is worse in the spring. But for the last three months he has been coughing continuously. Currently he is on albuterol inhaler alone. He is using this 2-3 times per day without a spacer. This does seem to help. He did go to see him PCP where he was placed on prednisone for one week. He was also given doxycyline which he remains on and he was placed on Tessalon perles for cough suppression. Prior to this, he was treated with prednisone and Augmentin in Urgent Care around two months ago. He did not take the prednisone at all the firs time due to a history of glaucoma. He took the most recent course of prednisone without. He has been on Singulair in the past for allergies, but otherwise no medications for asthma. He never noted any improvement.   Prior to three months ago, he was still not doing very well. 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 It has gotten progressively worse. Prior to working for [employer] he worked for [employer]. 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 He report sthat he would be covered in brake dust. The brakes were made of asbestos. This consisted of blowing brake dust off with an air gun. He did this for 2-3 years in total, helping in his spare time. Other than this he was just exposed to fumes from shops and maintenance facilities. 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   [name] does have a history of allergy symptoms. He did go to have an allergy resting performed here but he is unsure whether he had any specific allergies.  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 He ate shrimp and had red streaks on his face and neck as well as hand and eye swelling. He took benadryl with improvement. He avoided it over the years, including other shellfish. He tolerates fin fish without a problem. He did have an EpiPen at some point but it has since expired. He now avoids all seafood due to the risk of cross contamination.   He does have a history of OSA but is not on CPAP. He has never had a sleep study performed. He does have reflux and is on OTC Zantac or Pepcid. GERD has been acting up routinely for the last week or so. Otherwise, there is no history of other atopic diseases, including  asthma, drug allergies, food allergies, environmental allergies, stinging insect allergies, or urticaria. There is no significant infectious history. Vaccinations are up to date.    CXR (12/29/2015):   TECHNIQUE: Two view chest. COMPARISON: 2009 INDICATION: R05: Cough FINDINGS: Lungs/pleura: Shallow inspiration. Streaky opacities in the bilateral midlung zones and left lung base. No confluent consolidation or effusion. Heart/mediastinum: Normal size and contours. Bones: There is a mild anterior wedge compression fracture of T11.   IMPRESSION: Shallow inspiration with streaky basilar opacities possibly due to fibrosis or atypical infection. Short-term follow-up would be useful. Mild age indeterminate compression fracture T11.    Past Medical History: Patient Active Problem List   Diagnosis Date Noted  . Right arm weakness 11/30/2014  . Numbness and tingling in right hand 11/30/2014  . 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 Chronic daily headache 11/30/2014  . Airway hyperreactivity  10/14/2014  . Shoulder pain, right 10/14/2014  . Numbness and tingling 10/14/2014  . GERD (gastroesophageal reflux disease) 10/05/2014  . Allergic rhinitis 10/05/2014  . FOM (frequency of micturition) 05/29/2014  . Obstructive sleep apnea 08/15/2012  . Migraines   . Low back pain     Medication List:  Allergies as of 01/07/2016      Reactions   Shellfish-derived Products Anaphylaxis      Medication List       Accurate as of 01/07/16  1:27 PM. Always use your most recent med list.          albuterol 108 (90 Base) MCG/ACT inhaler Commonly known as:  PROVENTIL HFA;VENTOLIN HFA Inhale into the lungs.   benzonatate 100 MG capsule Commonly known as:  TESSALON Take by mouth.   doxycycline 100 MG capsule Commonly known as:  VIBRAMYCIN Take by mouth.   Ibuprofen 200 MG Caps Take by mouth 2 (two) times daily.       Birth History: non-contributory.  Developmental History: non-contributory.  Past Surgical History: Past Surgical History:  Procedure Laterality Date  . HERNIA REPAIR     1976  . TONSILLECTOMY       Family History: Family History  Problem Relation Age of Onset  . Alcoholism Father   . Diabetes Mother   . High Cholesterol Mother   . Diabetes Brother   . High Cholesterol Brother   . Emphysema Sister   . Asthma Neg Hx   . Allergic rhinitis Neg Hx   . Eczema Neg Hx   . Urticaria Neg Hx   . Immunodeficiency Neg Hx   . Atopy Neg Hx   . Angioedema Neg Hx      Social History: Guss lives at home with his wife in an apartment that is only two years old. They have carpeting in the bedrooms as well as the main living areas. They have electric heating and central cooling. There is one dog at home. They do not use dust mite covers on their bedding. There are no mildew or roach problems. 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     Review of Systems: a 14-point review of systems is  pertinent for what is mentioned in HPI.  Otherwise, all other systems were negative. Constitutional: negative other than that listed in the HPI Eyes: negative other than that listed in the HPI Ears, nose, mouth, throat, and face: negative other than that listed in the HPI Respiratory: negative other than that listed in the HPI Cardiovascular: negative other than  that listed in the HPI Gastrointestinal: negative other than that listed in the HPI Genitourinary: negative other than that listed in the HPI Integument: negative other than that listed in the HPI Hematologic: negative other than that listed in the HPI Musculoskeletal: negative other than that listed in the HPI Neurological: negative other than that listed in the HPI Allergy/Immunologic: negative other than that listed in the HPI    Objective:   Blood pressure 136/90, pulse 92, temperature 98.7 F (37.1 C), temperature source Oral, resp. rate (!) 28, height 5\' 11"  (1.803 m), weight 215 lb (97.5 kg). Body mass index is 29.99 kg/m.   Physical Exam:  General: Alert, interactive, in no acute distress. Cooperative with the exam. Very friendly. Able to form full sentences but does take frequent breaths Eyes: No conjunctival injection present on the right, No conjunctival injection present on the left, No discharge on the right and No discharge on the left Ears: Right TM pearly gray with normal light reflex, Left TM pearly gray with normal light reflex, Right TM intact without perforation and Left TM intact without perforation.  Nose/Throat: External nose within normal limits and septum midline, turbinates edematous with clear discharge, post-pharynx mildly erythematous without cobblestoning in the posterior oropharynx. Tonsils 2+ without exudates Neck: Supple without thyromegaly. Adenopathy: no enlarged lymph nodes appreciated in the anterior cervical, occipital, axillary, epitrochlear, inguinal, or popliteal  regions Lungs: Decreased breath sounds bilaterally without wheezing, rhonchi or rales. Increased work of breathing. CV: Normal S1/S2, no murmurs. Capillary refill <2 seconds.  Abdomen: Nondistended, nontender. No guarding or rebound tenderness. Bowel sounds present in all fields and hypoactive  Skin: Warm and dry, without lesions or rashes. However there is rosacea on his face with what looks like telangiectasias present Extremities:  No clubbing, cyanosis or edema.  Neuro:   Grossly intact. No focal deficits appreciated. Responsive to questions.  Diagnostic studies:  Spirometry: results abnormal (FEV1: 1.30/33%, FVC: 1.66/32%, FEV1/FVC: 79%).    Spirometry consistent with possible restrictive disease. Albuterol/Atrovent nebulizer treatment given in clinic with significant improvement in the FEV1 of 330mL (25%). The FVC remained stable. FVL appeared slightly more normal but was still peaked with a curve consistent with a restrictive process. Aeration was improved following the nebulizer treatment and he reported subjective improvement in his breathing.  Allergy Studies: Deferred due to patient request      Malachi BondsJoel Saif Peter, MD [facility] [place]

## 2016-01-26 ENCOUNTER — Telehealth: Payer: Self-pay

## 2016-01-26 DIAGNOSIS — J984 Other disorders of lung: Secondary | ICD-10-CM

## 2016-01-26 NOTE — Telephone Encounter (Signed)
Patient called.  He has had no improvement with symptoms.  He discussed being referred to a lung specialist with Dr. Dellis AnesGallagher on his last visit.  He would like to forge ahead with this referral.  Please call patient with referral information.

## 2016-01-26 NOTE — Telephone Encounter (Signed)
Reviewed note. I clearly remember [name] and have been worried about him. I agree with the referral. We will obtain full PFTs and a chest CT as well since the Pulmonologist will likely want this information in hand.  Malachi BondsJoel Kataleah Bejar, MD FAAAAI [facility] [place]

## 2016-01-26 NOTE — Telephone Encounter (Addendum)
LEFT MESSAGE FOR PT TO CALL.  DR Dellis AnesGALLAGHER SENT REFERRAL TO [facility] FOR PT TO SEE DR. EJAZ.  ALSO ORDER FOR CHEST CT W CONTRAST AT [facility].  1.  [facility] WILL CALL PATIENT WITH REFERRAL INFORMATION FOR DR Su MonksEJAZ.  2.  [facility] SCHEDULING NUMBER IS [phone].   SCHEDULED PATIENT FOR CT AT [facility] FOR Tuesday 02/01/2016 AT 2 PM. NO PREP.  3.  RESPIRATORY DEPT NUMBER IS [phone]. SCHEDULED FULL PFT'S WITH 6 MINUTE WALK PER DR GALLAGHER FOR 02/03/16 AT 11 AM. AT [facility].  USE [facility] (FREE). CHECK IN AT PATIENT OUTPATIENT ADMITTING.  NO INHALER USE 4 HOURS BEFORE TEST, NO SMOKING, NO CAFFEINE BEFORE TEST.

## 2016-01-27 ENCOUNTER — Ambulatory Visit: Payer: 59 | Admitting: Allergy and Immunology

## 2016-01-27 NOTE — Telephone Encounter (Signed)
Patient is set up to see Dr. Su MonksEjaz on Jan. 24 @ 10:40. Patient has been informed and I will send him a letter with the address and things he is to bring to his appt.  Thanks

## 2016-01-27 NOTE — Telephone Encounter (Signed)
Called patient.  Gave all information.  Pt requested I mail him a copy with all upcoming appointment information.  Mailed letter to patient with info.

## 2016-01-31 ENCOUNTER — Telehealth: Payer: Self-pay

## 2016-01-31 NOTE — Telephone Encounter (Signed)
Patient is allergic to shellfish.  Concerned about contrast dye for chest CT [facility] tomorrow [facility]. Spoke with Dr. Beaulah DinningBardelas (Dr. Dellis AnesGallagher is off today).  Per Dr. Beaulah DinningBardelas, [facility] and ask if they can pre-treat with Benadryl before scan.  Called radiology.  Doctor would have to order patient to pre-treat with Benadryl one hour before scan.  Also prednisone may be used 13 hours prior.  Again, doctor will have to order pre-treat and patient will discuss with the doctor.    Dr. Beaulah DinningBardelas did not order pre-treat.  Per Dr.Bardelas, if radiologist will not pre-treat, then discuss with Dr. Dellis AnesGallagher tomorrow as to what he wants to do.  Cancelled CT for tomorrow pending Dr. Ellouise NewerGallagher's revised orders.  Patient informed of cancellation.  Patient aware Dr. Dellis AnesGallagher off today and will discuss with patient tomorrow.  Patient would like to speak with Dr. Dellis AnesGallagher about concerns with all these tests.  Please call patient on Tuesday.

## 2016-02-01 ENCOUNTER — Ambulatory Visit (HOSPITAL_COMMUNITY): Payer: 59

## 2016-02-02 NOTE — Telephone Encounter (Signed)
I called [name] this morning and had a long conversation with him about the diagnostic plan. He is very concerned with the costs of all of the testing, therefore we decided to start with the chest CT and then go from there. He did read on the internet regarding shellfish allergy and contrast which we discussed for quite some time. He has never to his knowledge every had contrast yet alone a contrast reaction. Although in the past there was a connection between shellfish allergy and contrast reactions, subsequent studies have confirmed that shellfish or seafood allergy is not an independent risk factor for immediate hypersensitivity reactions to radiocontrast media. Patients allergic to seafood are not at increased risk beyond that of any atopic patient. The epidemiologic association between seafood allergy and radiocontrast media reactions has been attributed to a common iodine allergy, since there is high iodine content in seafood. However, iodine and iodide are small molecules that do not cause anaphylactic reactions and are structurally unrelated to shellfish allergens (which are tropomyosin proteins). Therefore, no premedication regimen is necessary. Patient is in agreement with the plan.  Plan: - Hold off on the Pulmonology Visit and the full PFTs. - We will go ahead with the chest CT with contrast.  Edward BondsJoel Gallagher, MD FAAAAI [facility] [place]

## 2016-02-02 NOTE — Telephone Encounter (Addendum)
[facility] SCHEDULING NUMBER IS [phone].   1.  SCHEDULED PATIENT FOR CHEST CT WITH CONTRAST AT [facility] FOR Friday 02/04/2016 AT 9 AM. ARRIVE AT 8:45 AM.  NPO 4 HOURS BEFORE CT SCAN.  2.  CALLED PATIENT WITH INFORMATION.  PATIENT IS SCHEDULED TO SEE DR Dellis AnesGALLAGHER ALSO ON Friday AT 3 PM.  RESPIRATORY DEPT NUMBER IS [phone]   3.  CALLED RESPIRATORY DEPARTMENT AT [facility]  AND CANCELLED FULL PFT'S WITH 6 MINUTE WALK FOR 02/03/16 AT 11 AM.   4.  PATIENT VERBALIZED UNDERSTANDING.

## 2016-02-02 NOTE — Telephone Encounter (Signed)
[facility] radiology called. CT needs pre-cert.   [employer] Clinical request for prior authorization is [id number].  Chest CT with contrast [place]PT code [place] 71260.  ICD 10 restrictive lung disease J98.4  Other disorders of lung. J98.4 is a billable/specific ICD-10-CM code that can be used to indicate a diagnosis for reimbursement purposes.   Called [employer] for PA.  Chest CT with contrast approved from 02/02/16 to 03/18/16.  Authorization number is [id number] for CPT code [id number].  [facility] informed [facility].

## 2016-02-03 ENCOUNTER — Encounter (HOSPITAL_COMMUNITY): Payer: Self-pay

## 2016-02-04 ENCOUNTER — Other Ambulatory Visit: Payer: Self-pay | Admitting: Allergy & Immunology

## 2016-02-04 ENCOUNTER — Ambulatory Visit (INDEPENDENT_AMBULATORY_CARE_PROVIDER_SITE_OTHER): Payer: 59 | Admitting: Allergy & Immunology

## 2016-02-04 ENCOUNTER — Encounter: Payer: Self-pay | Admitting: Allergy & Immunology

## 2016-02-04 ENCOUNTER — Ambulatory Visit (HOSPITAL_COMMUNITY)
Admission: RE | Admit: 2016-02-04 | Discharge: 2016-02-04 | Disposition: A | Discharge status: Home / Self Care | Payer: 59 | Source: Ambulatory Visit | Attending: Allergy & Immunology | Admitting: Allergy & Immunology

## 2016-02-04 VITALS — BP 130/76 | HR 96 | Temp 97.9°F | Resp 16

## 2016-02-04 DIAGNOSIS — J984 Other disorders of lung: Secondary | ICD-10-CM

## 2016-02-04 DIAGNOSIS — J4551 Severe persistent asthma with (acute) exacerbation: Secondary | ICD-10-CM | POA: Diagnosis not present

## 2016-02-04 DIAGNOSIS — I2510 Atherosclerotic heart disease of native coronary artery without angina pectoris: Secondary | ICD-10-CM | POA: Insufficient documentation

## 2016-02-04 DIAGNOSIS — I700 Atherosclerosis of aorta: Secondary | ICD-10-CM | POA: Diagnosis not present

## 2016-02-04 DIAGNOSIS — J8489 Other specified interstitial pulmonary diseases: Secondary | ICD-10-CM | POA: Diagnosis not present

## 2016-02-04 NOTE — Patient Instructions (Addendum)
1. Severe persistent asthma - improved lung testing today - We will try adding on a new medication: Spiriva Respimat 1.2725mcg two inhalation daily - Continue with Symbicort 160/4.5 two puffs twice daily. - I highly recommend that you see Dr. Su MonksEjaz to get his ideas.  2. Restrictive lung disease - Lung CT showed interstitial lung disease, but at this time we do not know what it was caused by. - I will provide prednisone 40mg  daily if your breathing becomes worsened.  - Dr. Su MonksEjaz will likely want to do a bronchoscopy and get a lung biopsy to further clarify your symptoms.  3. Return in about 3 months (around 05/04/2016).  Please inform us of any Emergency Department visits, hospitalizations, or changes in symptoms. Call us before going to the ED for breathing or allergy symptoms since we might be able to fit you in for a sick visit. Feel free to contact us anytime with any questions, problems, or concerns.  Websites that have reliable patient information: 1. American Academy of Asthma, Allergy, and Immunology: www.aaaai.org 2. Food Allergy Research and Education (FARE): foodallergy.org 3. Mothers of Asthmatics: http://www.asthmacommunitynetwork.org 4. American College of Allergy, Asthma, and Immunology: www.acaai.org

## 2016-02-04 NOTE — Progress Notes (Signed)
FOLLOW UP  Date of Service/Encounter:  02/04/16   Assessment:   Severe persistent asthma with acute exacerbation  Restrictive lung disease   Limited socioeconomic means   Asthma Reportables:  Severity: severe persistent  Risk: high Control: not well controlled  Seasonal Influenza Vaccine: yes    Plan/Recommendations:   1. Severe persistent asthma - with slightly improved spirometry today  - We will try adding on a new medication: Spiriva Respimat 1.75mcg two inhalation daily  - Sample provided and technique discussed.  - Continue with Symbicort 160/4.5 two puffs twice daily. - [name] already has an appointment with Dr. Su Monks to discuss treatment and diagnostic options on 02/16/16.  2. 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 - There were no mentions of granulomatous appearing lesions, but hypersensitivity pneumonitis should be kept on the differential.  - As [name] is acutely aware of costs, we will defer sending an hypersensitivity pneumonitis panel until Dr. Su Monks can weigh in. - If Dr. Su Monks feels that it would be helpful, he can send the HP panel at the same time that he send any other labs. - Collagen vascular diseases should also be considered but Mr [name] has no history of other symptoms of collagen vascular diseases.  - There are a variety of exposures that [name] has experienced through his workplace. - We will start prednisone 40mg  daily until he sees Dr. Su Monks in around two weeks.  - I anticipate that [name] will have a bronchoscopy and a biopsy for further clarification.   - We did provide [name] with a form to fill out to allow him to apply for [facility] Medical Assistance to help with office co-payments.   3. Return in about 3 months (around 05/04/2016).   Subjective:   [name] is a 56 y.o. male presenting today for follow up of  Chief Complaint  Patient presents with    . Asthma  . Gastroesophageal Reflux    [name] has a history of the following: Patient Active Problem List   Diagnosis Date Noted  . Right arm weakness 11/30/2014  . Numbness and tingling in right hand 11/30/2014  . 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 Chronic daily headache 11/30/2014  . Airway hyperreactivity 10/14/2014  . Shoulder pain, right 10/14/2014  . Numbness and tingling 10/14/2014  . GERD (gastroesophageal reflux disease) 10/05/2014  . Allergic rhinitis 10/05/2014  . FOM (frequency of micturition) 05/29/2014  . Obstructive sleep apnea 08/15/2012  . Migraines   . Low back pain     History obtained from: chart review and patient.  [name] was referred by No PCP Per Patient.     [name] is a 56 y.o. male presenting for a follow up visit. He was seen for his initial visit in December 2017. At that time, spirometry was notable for moderate-severe restrictive disease which was reversible (although it did not normalize. We started Symbicort 160/4.5 two puffs twice daily with spacer. We did not do skin testing because he did not want to incur the cost of testing. He also has a history of seafood allergy, but again he did not want to incur the cost of testing today. He was started on a PPI due to his chronic NSAID use.   Since the last visit, he has not improved at all. He does report that he has some good days and some bad days. He endorses excellent  compliance. Because of this, we did get a chest CT performed this morning that was notable for interstitial lung disease with NSIP versus UIP. He was also noted to have aortic calcifications. We referred him urgently to see [name] in Pulmonology. He endorses compliance with his Symbicort, however this never seems to help at all. 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   [name] remains on Flonase and  Zyrtec, He has had no accidental seafood exposures. Reflux has been controlled with a PPI although it is not entirely clear when he is using. He does have a significant exposure history at work. Currently he is working at [employer] in [employer], where he is exposed to a significant amount of dust and insect debris. He reports that the cleaning agents are "green" but there are still other chemicals around. 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                                                                                                                                                                                                                  Otherwise, there have been no changes to his past medical history, surgical history, family history, or social history.  Chest CT (02/05/16)  IMPRESSION: 1. The appearance of the lungs is compatible with interstitial lung disease. At this point, the findings are indeterminate and could either represent very early usual interstitial pneumonia (UIP), or nonspecific interstitial pneumonia (NSIP). Repeat high-resolution chest CT is recommended in 12 months to assess for further temporal changes in the appearance of the lung parenchyma if clinically appropriate. 2. Multiple borderline enlarged and minimally enlarged mediastinal and bilateral hilar lymph nodes are presumably chronic and reactive in the setting of interstitial lung disease. 3. Aortic atherosclerosis, in addition to left main and 2 vessel coronary artery disease. Please note that although the presence of coronary artery calcium documents the presence of coronary artery disease, the severity of this disease and any potential stenosis cannot be assessed on this non-gated CT examination. Assessment for potential risk factor modification, dietary therapy or pharmacologic therapy may be warranted, if clinically indicated. 4.  Additional incidental findings, as above.    Review of Systems: a 14-point review of systems is pertinent for what is mentioned in HPI.  Otherwise, all other systems were negative. Constitutional: negative other  than that listed in the HPI Eyes: negative other than that listed in the HPI Ears, nose, mouth, throat, and face: negative other than that listed in the HPI Respiratory: negative other than that listed in the HPI Cardiovascular: negative other than that listed in the HPI Gastrointestinal: negative other than that listed in the HPI Genitourinary: negative other than that listed in the HPI Integument: negative other than that listed in the HPI Hematologic: negative other than that listed in the HPI Musculoskeletal: negative other than that listed in the HPI Neurological: negative other than that listed in the HPI Allergy/Immunologic: negative other than that listed in the HPI    Objective:   Blood pressure 130/76, pulse 96, temperature 97.9 F (36.6 C), temperature source Oral, resp. rate 16. There is no height or weight on file to calculate BMI.   Physical Exam:  General: Alert, interactive, in no acute distress. Cooperative with the exam. Very friendly. Able to form full sentences but does take frequent breaths. Seems somewhat short of bread t Eyes: No conjunctival injection present on the right, No conjunctival injection present on the left, No discharge on the right and No discharge on the left Ears: Right TM pearly gray with normal light reflex, Left TM pearly gray with normal light reflex, Right TM intact without perforation and Left TM intact without perforation.  Nose/Throat: External nose within normal limits and septum midline, turbinates edematous with clear discharge, post-pharynx mildly erythematous without cobblestoning in the posterior oropharynx. Tonsils 2+ without exudates Neck:   Supple without thyromegaly. Adenopathy: no enlarged lymph nodes appreciated in  the anterior cervical, occipital, axillary, epitrochlear, inguinal, or popliteal regions Lungs:            Decreased breath sounds bilaterally without wheezing, rhonchi or rales. Increased work of breathing. CV:      Normal S1/S2, no murmurs. Capillary refill <2 seconds.  Abdomen: Nondistended, nontender. No guarding or rebound tenderness. Bowel sounds present in all fields and hypoactive  Skin:    Warm and dry, without lesions or rashes. However there is rosacea on his face with what looks like telangiectasias present Extremities:  No clubbing, cyanosis or edema.  Neuro:   Grossly intact. No focal deficits appreciated. Responsive to questions.   Diagnostic studies:    Spirometry: results abnormal with a continued low FEV1 and FVC in the 35-40% range with a normal FEV1/FVC ratio. Spirometry was consistent with severe restrictive disease. Spirometry scanned into the EMR.     Allergy Studies: None     Malachi Bonds, MD [facility] [place]

## 2016-02-06 ENCOUNTER — Encounter: Payer: Self-pay | Admitting: Allergy & Immunology

## 2016-02-06 DIAGNOSIS — J984 Other disorders of lung: Secondary | ICD-10-CM | POA: Insufficient documentation

## 2016-02-07 NOTE — Telephone Encounter (Signed)
Called patient.  He wanted to know if Dr. Dellis AnesGallagher would write him a letter for patient to take with him to initially apply for disability.  Patient will also contact [facility] Financial Assistance.  Spoke with Dr. Dellis AnesGallagher.  Notified him of letter needed.

## 2016-02-11 ENCOUNTER — Encounter: Payer: Self-pay | Admitting: Allergy & Immunology

## 2016-02-16 ENCOUNTER — Telehealth: Payer: Self-pay | Admitting: Allergy

## 2016-02-16 NOTE — Telephone Encounter (Signed)
Patient called and has seen Dr Su MonksEjaz. Patient wanted to know if Dr. Dellis AnesGallagher wanted to put him on any more prednisone? Informed patient that since he was seeing Dr. Su MonksEjaz to check with him about staying on prednisone. If so have him prescribe how he wanted him to take it.

## 2016-02-23 ENCOUNTER — Telehealth: Payer: Self-pay | Admitting: Allergy & Immunology

## 2016-02-23 MED ORDER — PREDNISONE 10 MG PO TABS: 20.0000 mg | ORAL_TABLET | Freq: Two times a day (BID) | ORAL | 1 refills | Status: AC

## 2016-02-23 NOTE — Telephone Encounter (Signed)
I talked to [name] to see how his appointment with Dr. Su MonksEjaz went. He reported that he did not seem to connect with him. He [facility] of [facility] (he does not remember the name right now). He is planning to have a bronchoscopy done with full pulmonary function testing [facility]. In the meantime, he has continued to take the prednisone (currently 20mg  twice daily). Dr. Su MonksEjaz did not provide much guidance on this dosing. We had discussed microaspiration and GERD as a contributor to his symptoms and lung pathology. However Dr. Su MonksEjaz did not provide a prescription for GERD medication or recommendations. Currently [name] is taking famotidine daily with some improvement but not complete alleviation of his symptoms. He is also getting lab results back from Dr. Su MonksEjaz but he has not had a discussion about what the results mean.  Plan: 1. I will send in a prescription for prednisone 20mg  twice daily to use until he follows up with the Pul[facility]ologist [facility]. 2. [name] will get me the name of the Pulmonologist so that I can write a letter and send records. 3. I recommended that he stop famotidine and start omeprazole 20mg  daily (can increase to twice daily if he continues to have symptoms).. 4. I will have our support staff request blood work from Dr. Fransisca ConnorsEjaz's office and I will call [name] to discuss.  5. We can discuss starting allergy shots (which the patient asked me about), although this will have to be done at a very slow pace given his precarious lung status.  6. I did offer any help that could be provided regarding his disability application.   Patient is in agreement with the plan.  Edward BondsJoel Melicia Esqueda, MD FAAAAI [facility] [place]

## 2016-02-25 ENCOUNTER — Telehealth: Payer: Self-pay

## 2016-02-25 NOTE — Telephone Encounter (Deleted)
02/25/16  Per Dr. Dellis AnesGallagher, please write note for patient regarding disability.   [name], [name]    Today, 1:38 PM  Hey there,  Sounds good - I will get one of my nurses to write it this afternoon and we will call you when it is done. I will hunt down your blood results and maybe we can talk next week? I have several office visits open next Friday I believe.  Thanks, [name]  -----Original Message----- From: [name] [[email or web address]]  Sent: Friday, February 25, 2016 10:16 AM To: [name], [name] @[email or web address]> Subject: [External Email]Re: Short term  *Caution - External Email*  A note for my employer [employer] to go out on short term until further notice. I'm letting them know I'm seeking additional treatment. I also need to come in with you after you review blood test for allergies and seek your advice.  Sent from my iPhone  > On Feb 25, 2016, at 10:04 AM, [name], [name] @[place] [email or web address]> wrote: >  > Hey there, >  > Just to clarify - you need one letter recommending that you take disability and another letter for [employer] justifying short term disability? Or are they the same letter?  >  > Whom should I make the letters addressed to? >  > Thanks!  > [name]  > -----Original Message----- > From: [name] [[email or web address]]  > Sent: Friday, February 25, 2016 9:00 AM > To: [name], [name] @[email or web address] [email or web address]> > Subject: [External Email]Short term >  > *Caution - External Email* >  > Dr.Gallagher, I meant to ask for a short term note for work. I will need note for [employer] for their short term disability. I will start on Monday next week. I can come by and pick up wherever needed. >  > Thank you, [name] >  > Sent from my iPhone >  >  > NOTICE: This message may contain confidential information intended only for the recipient. If you have received this communication in error, please notify the sender  immediately by replying to the message and deleting it from your computer. [name], [name]    Today, 1:36 PM  Thank you so much [name]!!   [name]   -----Original Message----- From: [name] [[email or web address]]  Sent: Friday, February 25, 2016 10:16 AM To: [name], [name] @[email or web address]> Subject: [External Email]Re: Short term  *Caution - External Email*  A note for my employer [employer] to go out on short term until further notice. I'm letting them know I'm seeking additional treatment. I also need to come in with you after you review blood test for allergies and seek your advice.  Sent from my iPhone  > On Feb 25, 2016, at 10:04 AM, [name], [name] @[email or web address]> wrote: >  > Hey there, >  > Just to clarify - you need one letter recommending that you take disability and another letter for [employer] justifying short term disability? Or are they the same letter?  >  > Whom should I make the letters addressed to? >  > Thanks!  > [name]  > -----Original Message----- > From: [name] [[email or web address]]  > Sent: Friday, February 25, 2016 9:00 AM > To: [name], [name] @[email or web address]> > Subject: [External Email]Short term >  > *Caution - External Email* >  > Dr.Gallagher, I meant to ask for a short term note for work. I will need note for [employer] for their short term disability. I will start on Monday next week. I can come by and pick up wherever needed. >  > Thank you, [name] >  >  Sent from my iPhone >  >  > NOTICE: This message may contain confidential information intended only for the recipient. If you have received this communication in error, please notify the sender immediately by replying to the message and deleting it from your computer.

## 2016-02-25 NOTE — Telephone Encounter (Signed)
Done.  Dr. Dellis AnesGallagher signed letter.  Called patient to pick up letter today and make an office visit with Dr. Dellis AnesGallagher for next Friday. Had to leave message on voice mail.

## 2016-02-25 NOTE — Telephone Encounter (Signed)
Per Dr. Dellis AnesGallagher, please write a note for patient to his employer [employer] for disability. Info is in an email patient sent to Dr. Dellis AnesGallagher.

## 2016-02-25 NOTE — Telephone Encounter (Deleted)
02/25/16  Per Dr. Dellis AnesGallagher, please write note for patient regarding disability.   [name], [name]    Today, 1:38 PM  Hey there,  Sounds good - I will get one of my nurses to write it this afternoon and we will call you when it is done. I will hunt down your blood results and maybe we can talk next week? I have several office visits open next Friday I believe.  Thanks, [name]  -----Original Message----- From: [name] [[email or web address]]  Sent: Friday, February 25, 2016 10:16 AM To: [name], [name] @[place] .[email or web address]> Subject: [External Email]Re: Short term  *Caution - External Email*  A note for my employer [employer] to go out on short term until further notice. I'm letting them know I'm seeking additional treatment. I also need to come in with you after you review blood test for allergies and seek your advice.  Sent from my iPhone  > On Feb 25, 2016, at 10:04 AM, [name], [name] @[place] .[email or web address]> wrote: >  > Hey there, >  > Just to clarify - you need one letter recommending that you take disability and another letter for [employer] justifying short term disability? Or are they the same letter?  >  > Whom should I make the letters addressed to? >  > Thanks!  > [name]  > -----Original Message----- > From: [name] [[email or web address]]  > Sent: Friday, February 25, 2016 9:00 AM > To: [name], [name] @[email or web address] .[email or web address]> > Subject: [External Email]Short term >  > *Caution - External Email* >  > Dr.Gallagher, I meant to ask for a short term note for work. I will need note for [employer] for their short term disability. I will start on Monday next week. I can come by and pick up wherever needed. >  > Thank you, [name] >  > Sent from my iPhone >  >  > NOTICE: This message may contain confidential information intended only for the recipient. If you have received this communication in error, please notify the sender  immediately by replying to the message and deleting it from your computer. [name], [name]    Today, 1:36 PM  Thank you so much [name]!!   [name]   -----Original Message----- From: [name] [[email or web address]]  Sent: Friday, February 25, 2016 10:16 AM To: [name], [name] @[email or web address]> Subject: [External Email]Re: Short term  *Caution - External Email*  A note for my employer [employer] to go out on short term until further notice. I'm letting them know I'm seeking additional treatment. I also need to come in with you after you review blood test for allergies and seek your advice.  Sent from my iPhone  > On Feb 25, 2016, at 10:04 AM, [name], [name] @[email or web address] [email or web address]> wrote: >  > Hey there, >  > Just to clarify - you need one letter recommending that you take disability and another letter for [employer] justifying short term disability? Or are they the same letter?  >  > Whom should I make the letters addressed to? >  > Thanks!  > [name]  > -----Original Message----- > From: [name] [[email or web address]]  > Sent: Friday, February 25, 2016 9:00 AM > To: [name], [name] @[email or web address] [email or web address]> > Subject: [External Email]Short term >  > *Caution - External Email* >  > Dr.Gallagher, I meant to ask for a short term note for work. I will need note for [employer] for their short term disability. I will start on Monday next week. I can come by and pick up wherever needed. >  > Thank you, [name] >  >  Sent from my iPhone >  >  > NOTICE: This message may contain confidential information intended only for the recipient. If you have received this communication in error, please notify the sender immediately by replying to the message and deleting it from your computer.

## 2016-02-29 ENCOUNTER — Telehealth: Payer: Self-pay | Admitting: Allergy

## 2016-02-29 NOTE — Telephone Encounter (Signed)
That is fine - would you mind calling it in on my behalf? Tessalon pearles 200mg  TID PRN. Dispense 21 tablets without refills.  Thanks, Malachi BondsJoel Gallagher, MD FAAAAI [facility] [place]

## 2016-02-29 NOTE — Telephone Encounter (Addendum)
[place] called [place] was in [place] and he is having a dry hacking cough. No wheezing. Wanted to know if you could call him in the [employer] or something for his cough. Call in to [facility] . [name] is coming in Friday to see you. Phone number to drug store is [phone]. [name]'s phone number is [phone]

## 2016-03-01 ENCOUNTER — Telehealth: Payer: Self-pay | Admitting: Allergy

## 2016-03-01 MED ORDER — BENZONATATE 100 MG PO CAPS: 200.0000 mg | ORAL_CAPSULE | Freq: Three times a day (TID) | ORAL | 0 refills | Status: AC | PRN

## 2016-03-01 NOTE — Telephone Encounter (Signed)
Called in [place]  to [facility] [place]. Phone number [phone]. Informed patient.

## 2016-03-01 NOTE — Telephone Encounter (Signed)
RX sent

## 2016-03-03 ENCOUNTER — Ambulatory Visit (INDEPENDENT_AMBULATORY_CARE_PROVIDER_SITE_OTHER): Payer: 59 | Admitting: Allergy & Immunology

## 2016-03-03 ENCOUNTER — Encounter: Payer: Self-pay | Admitting: Allergy & Immunology

## 2016-03-03 VITALS — BP 158/84 | HR 116 | Temp 98.2°F | Resp 24

## 2016-03-03 DIAGNOSIS — J984 Other disorders of lung: Secondary | ICD-10-CM | POA: Diagnosis not present

## 2016-03-03 DIAGNOSIS — J4550 Severe persistent asthma, uncomplicated: Secondary | ICD-10-CM

## 2016-03-03 DIAGNOSIS — J849 Interstitial pulmonary disease, unspecified: Secondary | ICD-10-CM | POA: Diagnosis not present

## 2016-03-03 DIAGNOSIS — J3081 Allergic rhinitis due to animal (cat) (dog) hair and dander: Secondary | ICD-10-CM | POA: Diagnosis not present

## 2016-03-03 MED ORDER — ALBUTEROL SULFATE (2.5 MG/3ML) 0.083% IN NEBU: 2.5000 mg | INHALATION_SOLUTION | RESPIRATORY_TRACT | 2 refills | Status: AC | PRN

## 2016-03-03 MED ORDER — HYDROCODONE-CHLORPHENIRAMINE 5-4 MG/5ML PO SOLN: 5.0000 mL | Freq: Four times a day (QID) | ORAL | 0 refills | Status: AC | PRN

## 2016-03-03 MED ORDER — PANTOPRAZOLE SODIUM 40 MG PO TBEC: 40.0000 mg | DELAYED_RELEASE_TABLET | Freq: Every day | ORAL | 5 refills | Status: AC
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
## 2016-03-03 NOTE — Patient Instructions (Addendum)
1. Severe persistent asthma - with overlying restrictive lung disease on chronic prednisone use - We will send in a prescription for a cough medicine: Tussinex 5mL every 6hrs as needed  - Daily controller medication(s): Symbicort 160/4.5 two puffs twice daily with spacer + Spiriva two inhalations once daily - Rescue medications: ProAir 4 puffs every 4-6 hours as needed - Asthma control goals:  * Full participation in all desired activities (may need albuterol before activity) * Albuterol use two time or less a week on average (not counting use with activity) * Cough interfering with sleep two time or less a month * Oral steroids no more than once a year * No hospitalizations  2. Restrictive lung disease - We will send all of your records to the physician at [facility]. - I anticipate that they will be able to create a treatment plan for your lung disease.  - Continue with prednisone 20mg  twice daily until Dr. Sheffield Slider comes up with a more specific plan.  - Workup for hypersensitivity pneumonitis, sarcoidosis, and rheumatologic causes was negative.  3. Allergic rhinitis (dust mites, grasses, trees, weeds, cat, dog, roach) - We would like to get a more extended mold panel (blood work). - Avoidance measures provided.  - We will plan to start allergy shots at a very slow rate. - AuviQ up to date.  - You can make your first appointment in two weeks for the first injection.   4. Reflux - We will send in a prescription for you for Protonix (a new GI medication): 40mg  once daily. - Stop the Prilosec for now.   5. Return in about 2 months (around 05/01/2016).  Please inform us of any Emergency Department visits, hospitalizations, or changes in symptoms. Call us before going to the ED for breathing or allergy symptoms since we might be able to fit you in for a sick visit. Feel free to contact us anytime with any questions, problems, or concerns.  Websites that have reliable patient information: 1.  American Academy of Asthma, Allergy, and Immunology: www.aaaai.org 2. Food Allergy Research and Education (FARE): foodallergy.org 3. Mothers of Asthmatics: http://www.asthmacommunitynetwork.org 4. American College of Allergy, Asthma, and Immunology: www.acaai.org  Control of House Dust Mite Allergen    House dust mites play a major role in allergic asthma and rhinitis.  They occur in environments with high humidity wherever human skin, the food for dust mites is found. High levels have been detected in dust obtained from mattresses, pillows, carpets, upholstered furniture, bed covers, clothes and soft toys.  The principal allergen of the house dust mite is found in its feces.  A gram of dust may contain 1,000 mites and 250,000 fecal particles.  Mite antigen is easily measured in the air during house cleaning activities.    1. Encase mattresses, including the box spring, and pillow, in an air tight cover.  Seal the zipper end of the encased mattresses with wide adhesive tape. 2. Wash the bedding in water of 130 degrees Farenheit weekly.  Avoid cotton comforters/quilts and flannel bedding: the most ideal bed covering is the dacron comforter. 3. Remove all upholstered furniture from the bedroom. 4. Remove carpets, carpet padding, rugs, and non-washable window drapes from the bedroom.  Wash drapes weekly or use plastic window coverings. 5. Remove all non-washable stuffed toys from the bedroom.  Wash stuffed toys weekly. 6. Have the room cleaned frequently with a vacuum cleaner and a damp dust-mop.  The patient should not be in a room which is being  cleaned and should wait 1 hour after cleaning before going into the room. 7. Close and seal all heating outlets in the bedroom.  Otherwise, the room will become filled with dust-laden air.  An electric heater can be used to heat the room. 8. Reduce indoor humidity to less than 50%.  Do not use a humidifier.  Reducing Pollen Exposure  The American  Academy of Allergy, Asthma and Immunology suggests the following steps to reduce your exposure to pollen during allergy seasons.    1. Do not hang sheets or clothing out to dry; pollen may collect on these items. 2. Do not mow lawns or spend time around freshly cut grass; mowing stirs up pollen. 3. Keep windows closed at night.  Keep car windows closed while driving. 4. Minimize morning activities outdoors, a time when pollen counts are usually at their highest. 5. Stay indoors as much as possible when pollen counts or humidity is high and on windy days when pollen tends to remain in the air longer. 6. Use air conditioning when possible.  Many air conditioners have filters that trap the pollen spores. 7. Use a HEPA room air filter to remove pollen form the indoor air you breathe.  Control of Dog or Cat Allergen  Avoidance is the best way to manage a dog or cat allergy. If you have a dog or cat and are allergic to dog or cats, consider removing the dog or cat from the home. If you have a dog or cat but don't want to find it a new home, or if your family wants a pet even though someone in the household is allergic, here are some strategies that may help keep symptoms at bay:  1. Keep the pet out of your bedroom and restrict it to only a few rooms. Be advised that keeping the dog or cat in only one room will not limit the allergens to that room. 2. Don't pet, hug or kiss the dog or cat; if you do, wash your hands with soap and water. 3. High-efficiency particulate air (HEPA) cleaners run continuously in a bedroom or living room can reduce allergen levels over time. 4. Regular use of a high-efficiency vacuum cleaner or a central vacuum can reduce allergen levels. 5. Giving your dog or cat a bath at least once a week can reduce airborne allergen.  Control of Cockroach Allergen  Cockroach allergen has been identified as an important cause of acute attacks of asthma, especially in urban settings.   There are fifty-five species of cockroach that exist in the [place], however only three, the [place], [place] and Oriental species produce allergen that can affect patients with Asthma.  Allergens can be obtained from fecal particles, egg casings and secretions from cockroaches.    1. Remove food sources. 2. Reduce access to water. 3. Seal access and entry points. 4. Spray runways with 0.5-1% Diazinon or Chlorpyrifos 5. Blow boric acid power under stoves and refrigerator. 6. Place bait stations (hydramethylnon) at feeding sites.

## 2016-03-07 ENCOUNTER — Telehealth: Payer: Self-pay | Admitting: Allergy & Immunology

## 2016-03-07 NOTE — Telephone Encounter (Signed)
Letter to Dr. Merilynn FinlandStephen Bergin prepared and faxed.  Malachi BondsJoel Amada Hallisey, MD FAAAAI [facility] [place]

## 2016-03-14 ENCOUNTER — Telehealth: Payer: Self-pay | Admitting: Allergy & Immunology

## 2016-03-14 NOTE — Telephone Encounter (Signed)
I received an email from [name] requesting my help in extending his short term disability.   Dr Dellis AnesGallagher, can you fax your notes from last visit I had with you to [facility] for the STD claim. They only have my benefits lasting till the 2427 of this month. If you can let them know I need extended time off in order to explore options for treatment they will extend further but need your notes. My claim number is [id number]. They need to go to these fax numbers. [phone] and [phone].  Letter prepared and faxed to the numbers.   Malachi BondsJoel Rocco Kerkhoff, MD FAAAAI [facility] [place]

## 2016-04-04 ENCOUNTER — Telehealth: Payer: Self-pay | Admitting: Allergy & Immunology

## 2016-04-04 MED ORDER — HYDROCODONE-CHLORPHENIRAMINE 5-4 MG/5ML PO SOLN: 5.0000 mL | Freq: Four times a day (QID) | ORAL | 0 refills | Status: AC | PRN

## 2016-04-04 NOTE — Telephone Encounter (Signed)
I received an email from [name] requesting additional Tuissionex. Although we typically do not give repeated courses of this, since we are still in the midst of determining the etiology of [name]'s symptoms and his diagnostic workup, I feel comfortable doing this for another couple of months. He is slated to have a bronchoscopy and lung biopsy which will help with diagnosis and further management. I am optimistic that we can get him on a different drug regimen once we know more about his disease process.   Malachi BondsJoel Endi Lagman, MD FAAAAI [facility] [place]

## 2016-04-07 MED ORDER — HYDROCOD POLST-CPM POLST ER 10-8 MG/5ML PO SUER: 5.0000 mL | Freq: Two times a day (BID) | ORAL | 0 refills | Status: AC | PRN

## 2016-04-07 NOTE — Telephone Encounter (Signed)
Pharmacy called stating that they no longer make hydrocodone-chlorpheniramine solution. Please advise on another med to send in.

## 2016-04-07 NOTE — Addendum Note (Signed)
Addended by: Alfonse SpruceGALLAGHER, Abimbola Aki LOUIS on: 04/07/2016 04:18 PM   Modules accepted: Orders

## 2016-04-07 NOTE — Telephone Encounter (Addendum)
Called pharmacy. Prescription was not written for the correct strength. Confirmed the correct dosage. Will drop of script on my way home today.  Malachi BondsJoel Vaughan Garfinkle, MD FAAAAI [facility] [place]

## 2016-05-03 ENCOUNTER — Encounter: Payer: Self-pay | Admitting: Allergy & Immunology

## 2016-05-03 NOTE — Telephone Encounter (Signed)
Dr. Dellis Anes,   [place] spoke with you about this today.  I wanted to forward this to you via EPIC. Thanks. Jerilynn Feldmeier

## 2016-05-04 NOTE — Telephone Encounter (Signed)
I called the Transplant Attending and discussed the case with him. He relayed the biopsy results to me and essentially said that whatever pulmonary process was going on in [name], it flared following the procedure. This combined with the pneumothorax resulted in his current critical state. He was progressed to ECMO and is now working on physical therapy so that he can be placed on the lung transplant list. There were also several episodes of asystole noted which complicated matters further. I did email [name] pulmonologist - Dr. Merilynn Finland - and unfortunately received an automatic message back letting me know that he was out of the office.   I called [name] wife yesterday evening. We discussed his situation and I affirmed with his wife [name]) that the biopsy unfortunately was not so useful since it was so late in his disease progression. [name] was still apparently talk but remains in dire spirits. [name] tells me that they are "pushing" her to make him DNR, however she is not prepared to do that at this time. She is understandably distraught at the current situation and blames herself for encouraging him to get the biopsy performed. I did my best to empathize with her and told her that I would be staying in daily contact with the team. She was very appreciative. At this time, I cannot think of anything else to do for him. He certainly needs to become more stabilized on the ECMO and increase his physical endurance before being listed on the transplant list.   Edward Bonds, MD FAAAAI [facility]

## 2016-05-05 ENCOUNTER — Ambulatory Visit: Payer: 59 | Admitting: Allergy & Immunology

## 2016-05-23 DEATH — deceased

## 2016-07-21 IMAGING — CT CT HEAD W/O CM
1 series · 15 of 30 positions shown, 19 images · non-contrast
Comparison: CT of the head performed 05/19/2009

CLINICAL DATA: Acute onset of right arm numbness and tingling.
Right arm weakness. Initial encounter.

EXAM:
CT HEAD WITHOUT CONTRAST
TECHNIQUE: Contiguous axial images were obtained from the base of the skull
through the vertex without intravenous contrast.

[Series 2: head 4.8 h37s · axial · 0.45mm/px · z∈[-145,+11]mm · 15 of 36 slices shown, 19 images]
[im 2/36  brain]
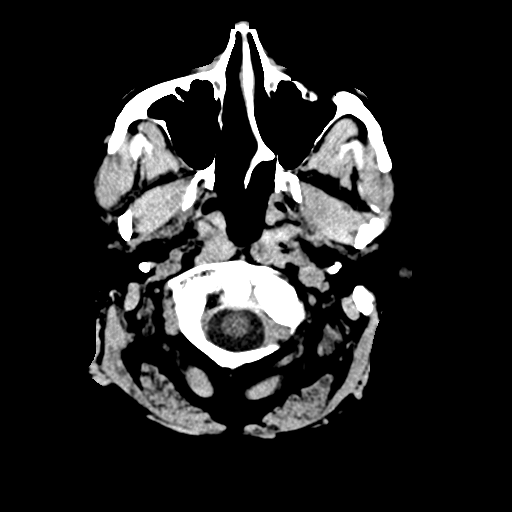
[im 2/36  bone]
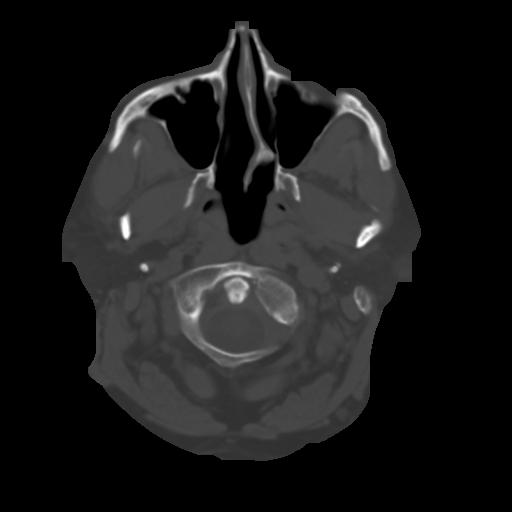
[im 4/36  brain]
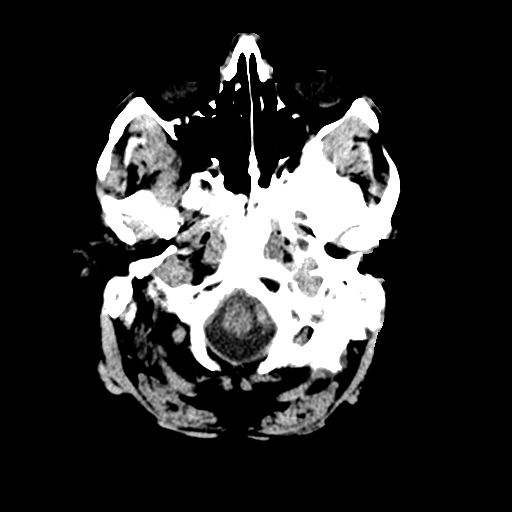
[im 7/36  brain]
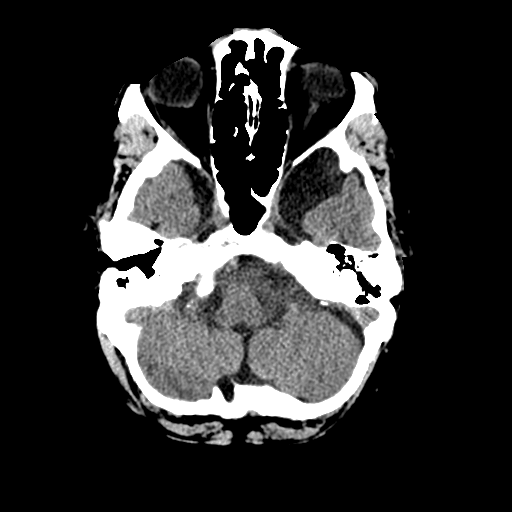
[im 9/36  brain]
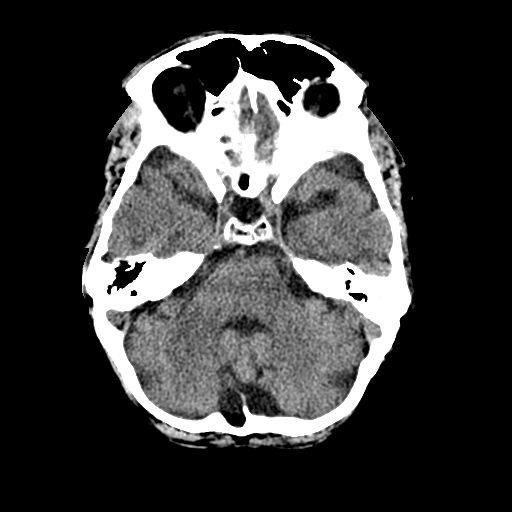
[im 11/36  brain]
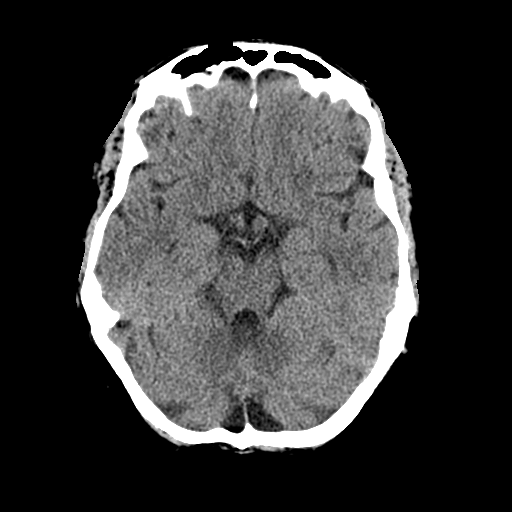
[im 11/36  bone]
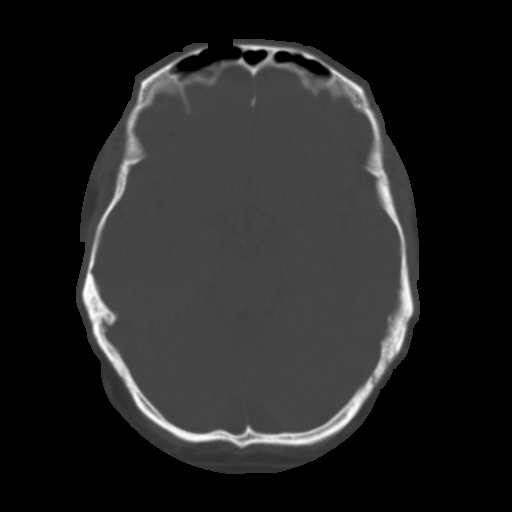
[im 14/36  brain]
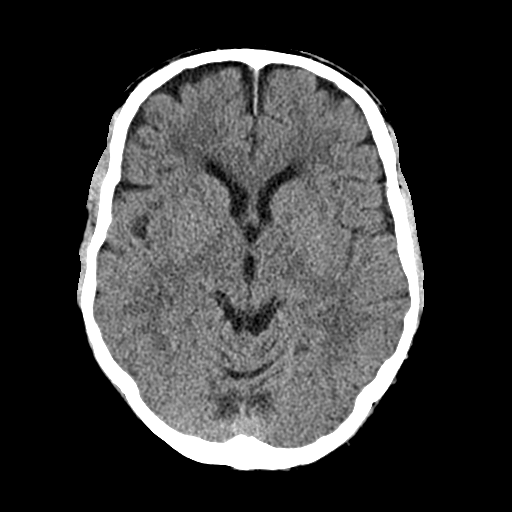
[im 16/36  brain]
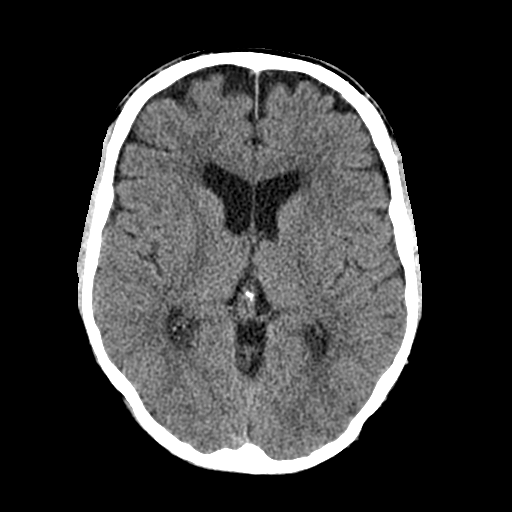
[im 19/36  brain]
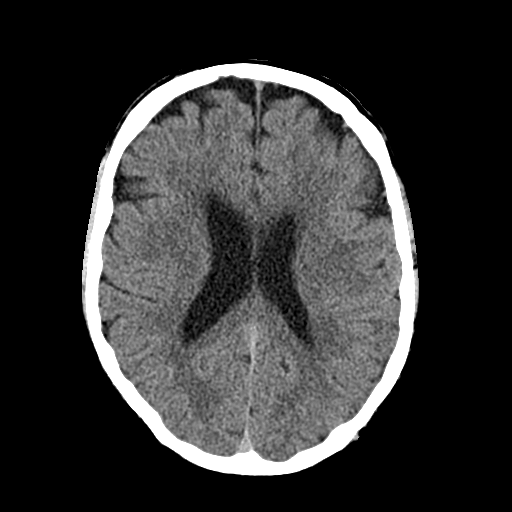
[im 20/36  brain]
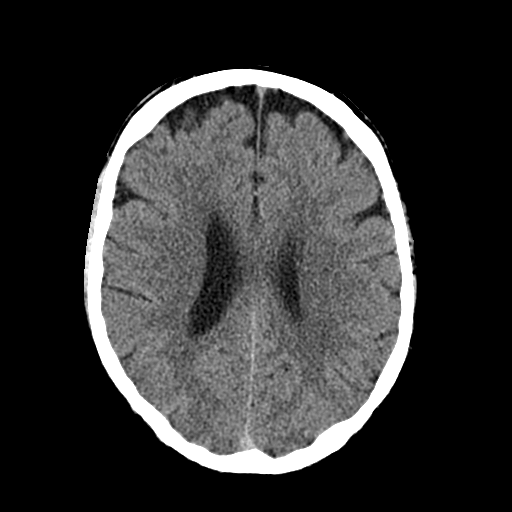
[im 20/36  bone]
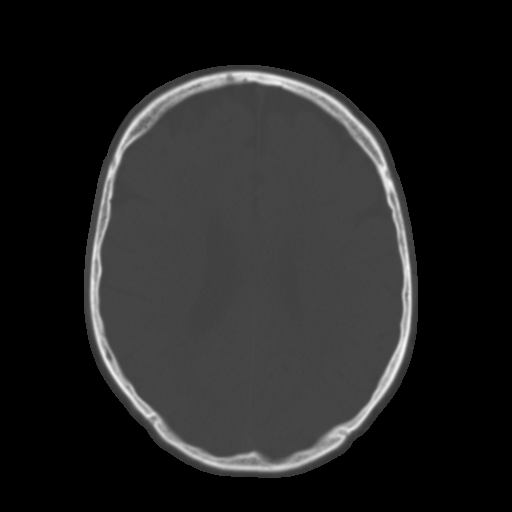
[im 22/36  brain]
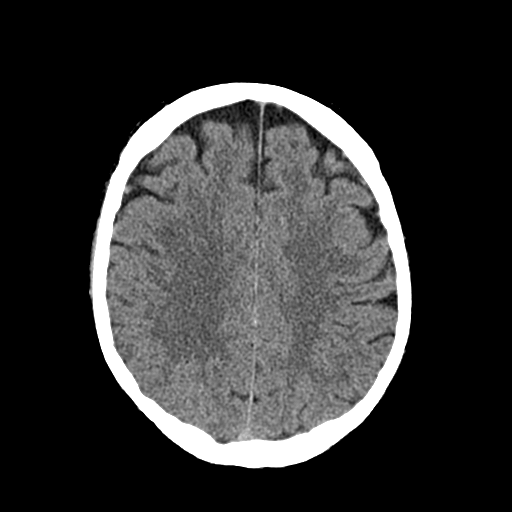
[im 25/36  brain]
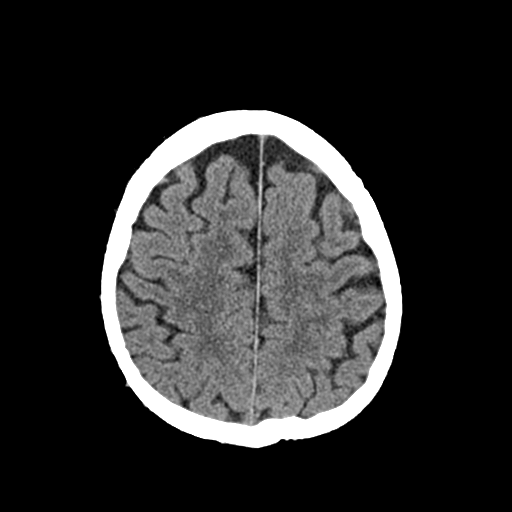
[im 27/36  brain]
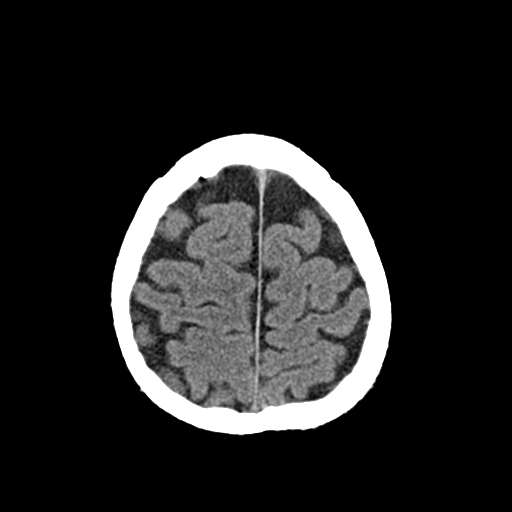
[im 29/36  brain]
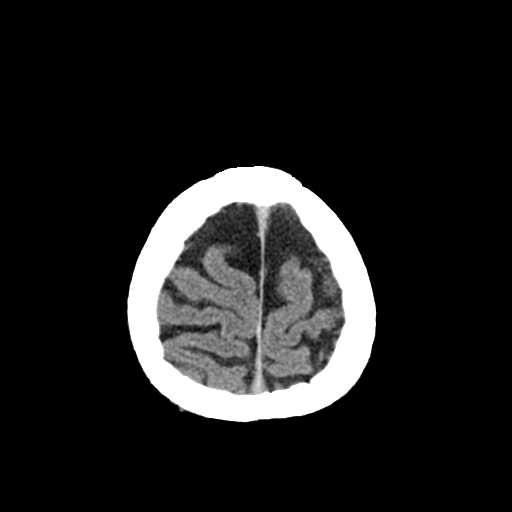
[im 29/36  bone]
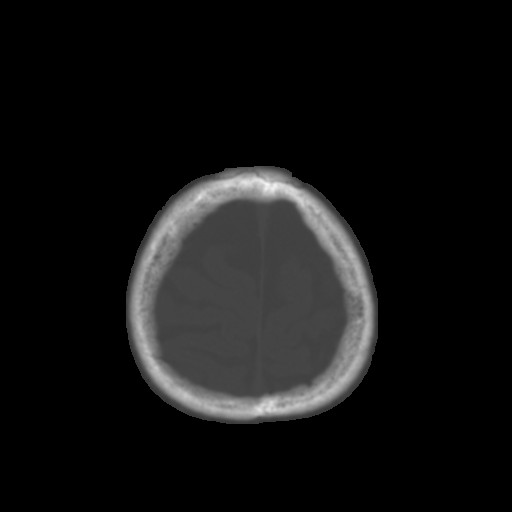
[im 32/36  brain]
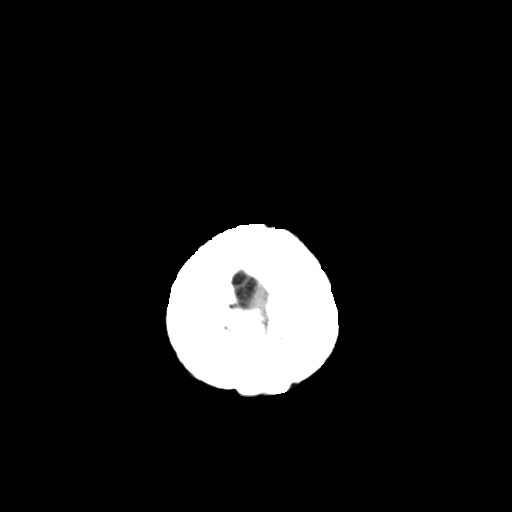
[im 34/36  brain]
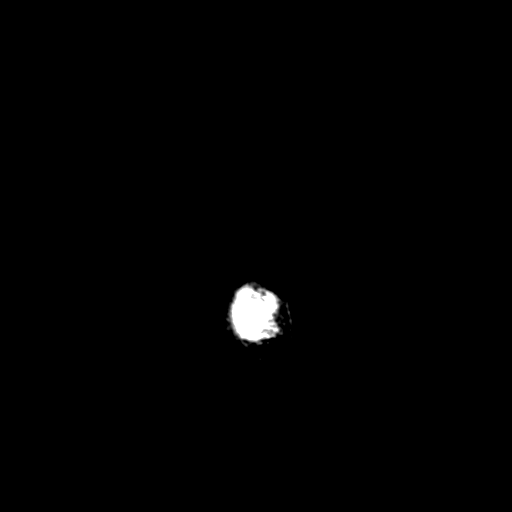

[15 of 30 positions shown; findings below may reference images not displayed]

FINDINGS: There is no evidence of acute infarction, mass lesion, or intra- or
extra-axial hemorrhage on CT.

Prominence of the ventricles and sulci reflects mild to moderate
cortical volume loss. Cerebellar atrophy is noted. Scattered
periventricular and subcortical white matter change likely reflects
small ischemic microangiopathy.

The brainstem and fourth ventricle are within normal limits. The
basal ganglia are unremarkable in appearance. The cerebral
hemispheres demonstrate grossly normal gray-white differentiation.
No mass effect or midline shift is seen.

There is no evidence of fracture; visualized osseous structures are
unremarkable in appearance. The orbits are within normal limits. The
paranasal sinuses and mastoid air cells are well-aerated. No
significant soft tissue abnormalities are seen.
IMPRESSION: 1. No acute intracranial pathology seen on CT.
2. Mild to moderate cortical volume loss and scattered small vessel
ischemic microangiopathy.

## 2016-07-30 IMAGING — DX DG ELBOW COMPLETE 3+V*R*
4 series · 4 of 4 positions shown · non-contrast
Comparison: None.

CLINICAL DATA: Elbow numbness for 2 weeks.  No known injury

EXAM:
RIGHT ELBOW - COMPLETE 3+ VIEW

[elbow ap]
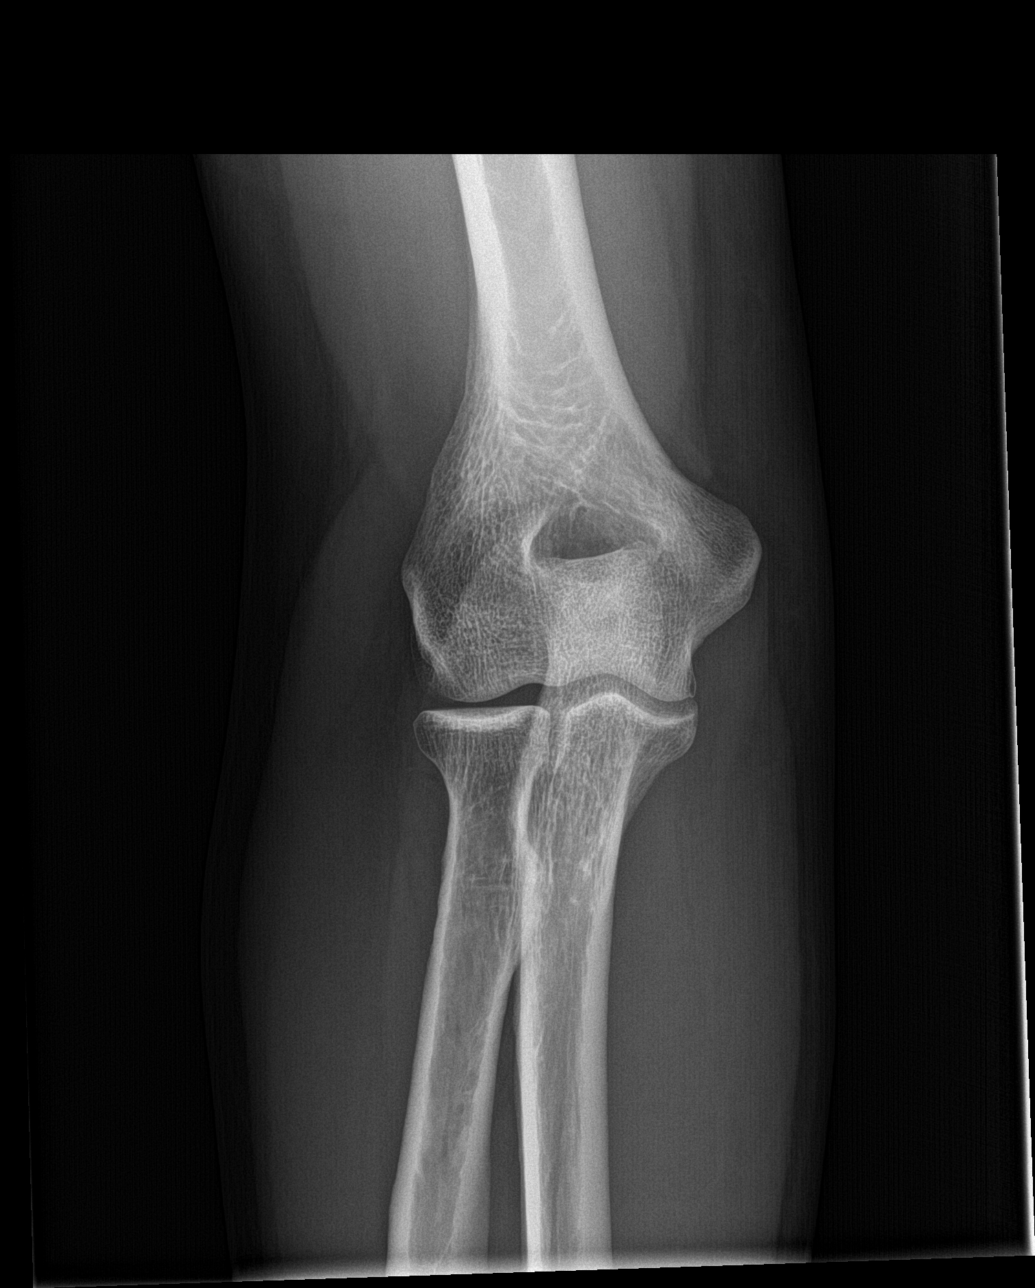

[elbow obl (1 of 2)]
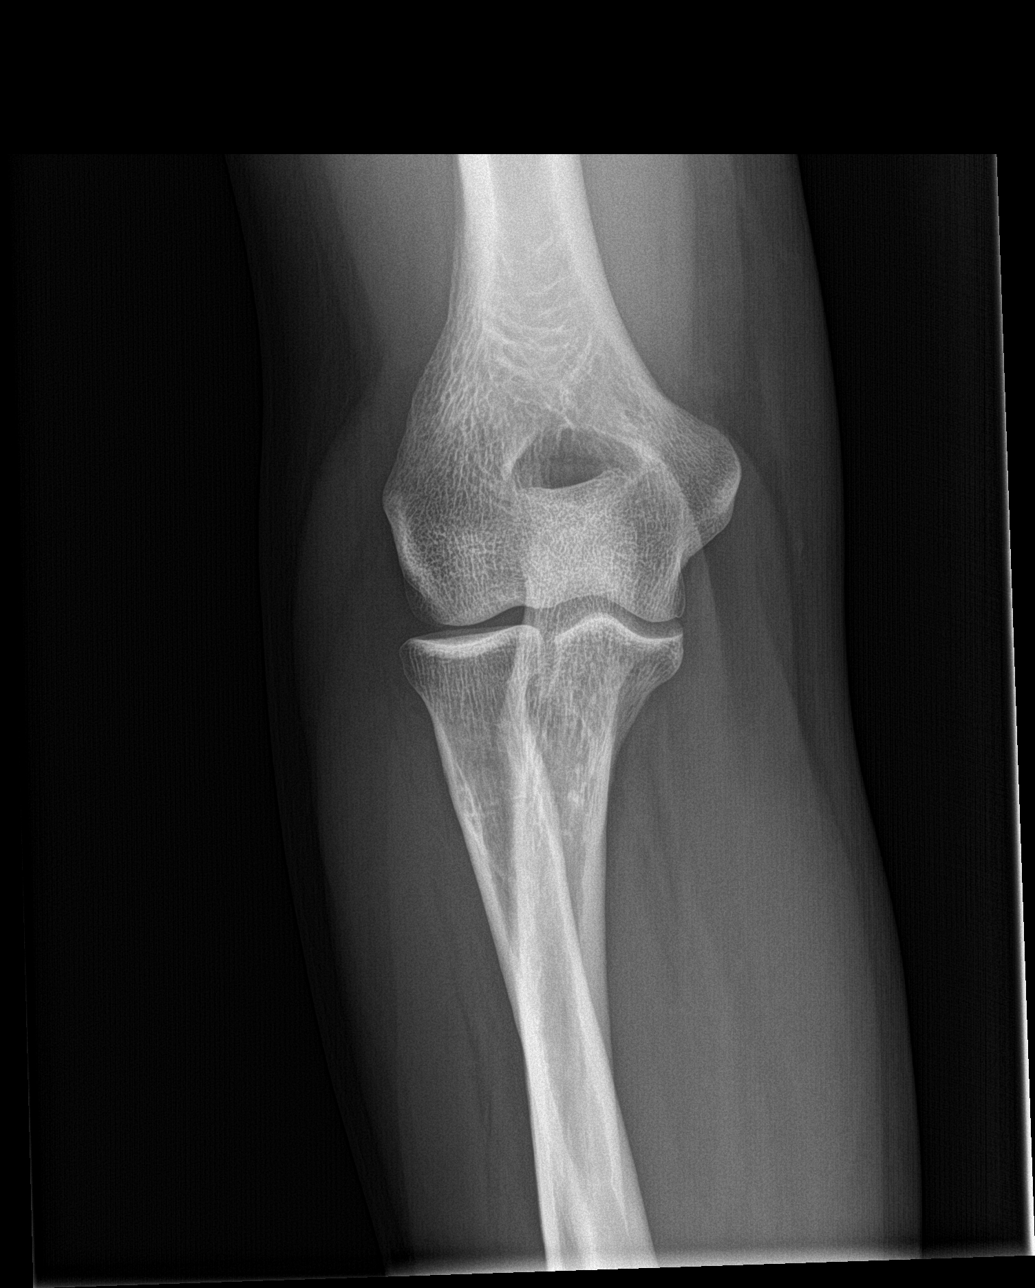

[elbow obl (2 of 2)]
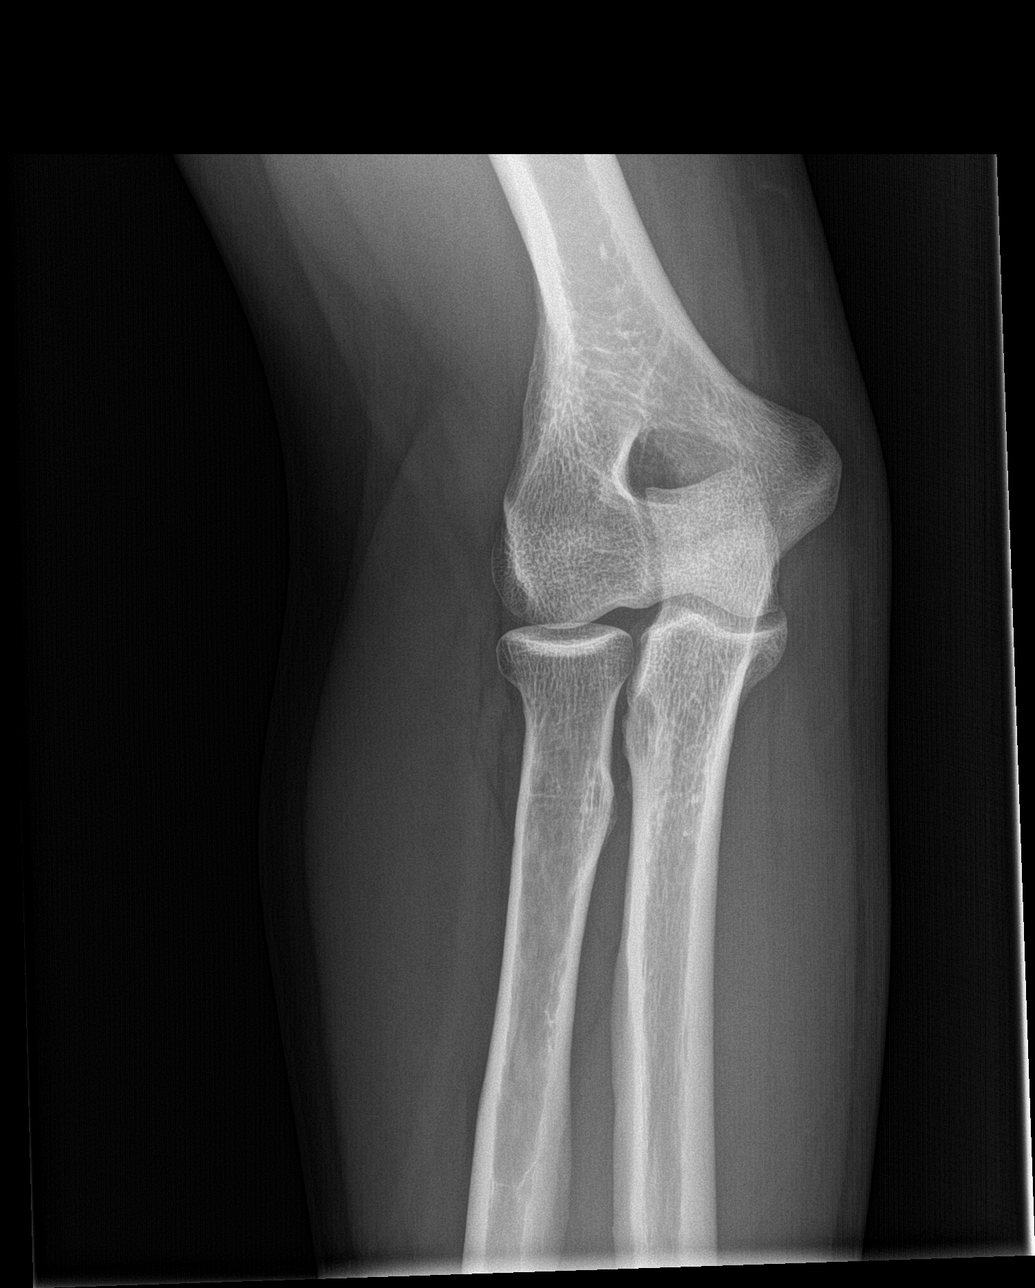

[elbow lat]
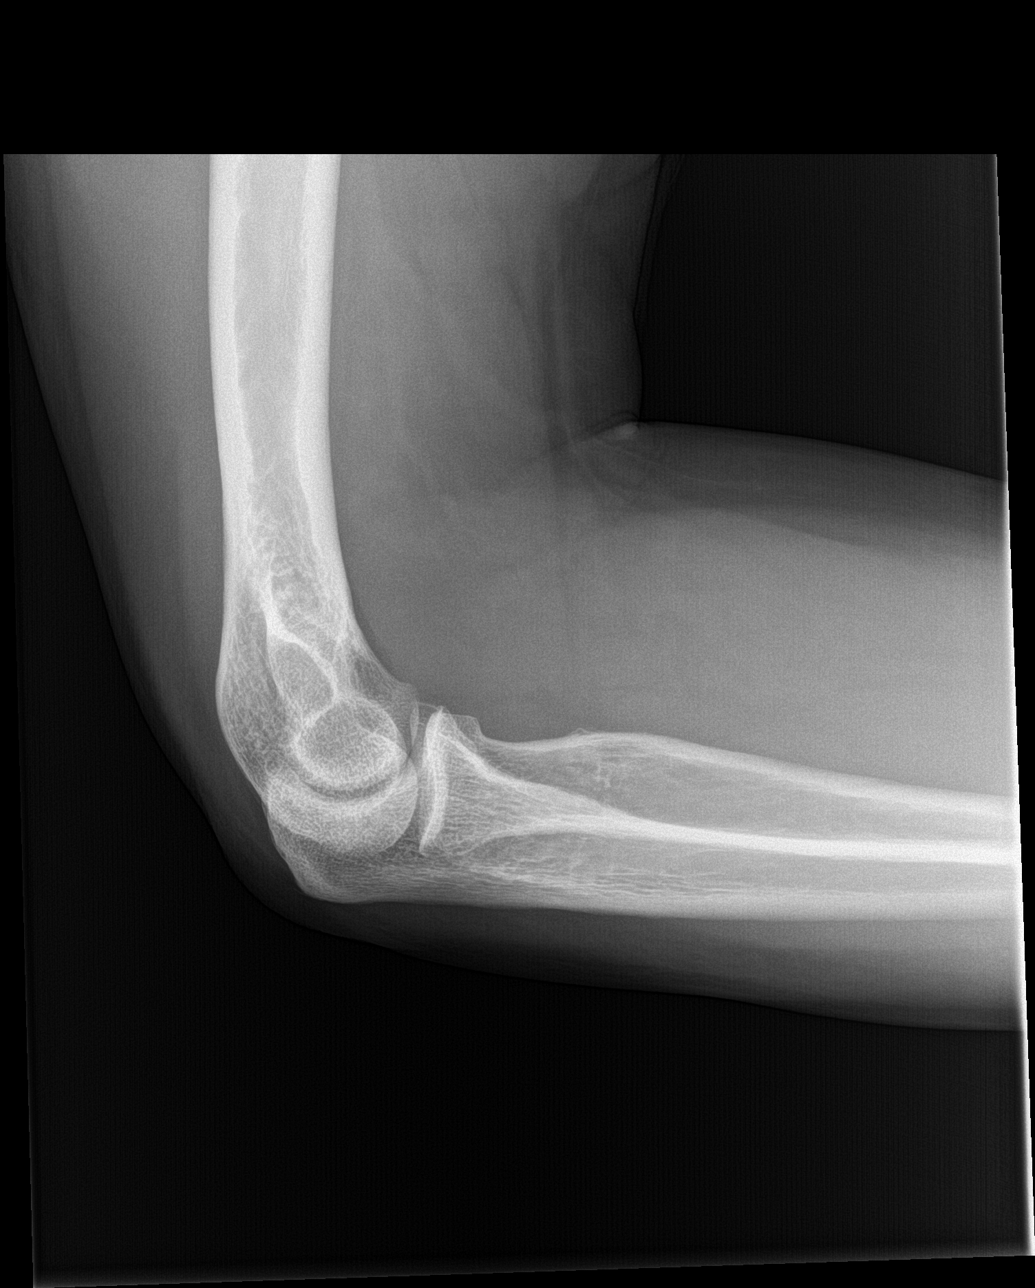

[4 of 4 positions shown; findings below may reference images not displayed]

FINDINGS: There is no evidence of fracture, dislocation, or joint effusion.
There is no evidence of arthropathy or other focal bone abnormality.
Soft tissues are unremarkable.
IMPRESSION: Negative.
